# Patient Record
Sex: Female | Born: 1945 | Race: White | Hispanic: No | Marital: Married | Smoking: Never smoker
Health system: Southern US, Community
[De-identification: ages and names within clinical notes are randomized; demographics above are authoritative.]

## PROBLEM LIST (undated history)

## (undated) DIAGNOSIS — Z973 Presence of spectacles and contact lenses: Secondary | ICD-10-CM

## (undated) DIAGNOSIS — C4491 Basal cell carcinoma of skin, unspecified: Secondary | ICD-10-CM

## (undated) DIAGNOSIS — J309 Allergic rhinitis, unspecified: Secondary | ICD-10-CM

## (undated) DIAGNOSIS — S0590XA Unspecified injury of unspecified eye and orbit, initial encounter: Secondary | ICD-10-CM

## (undated) DIAGNOSIS — K219 Gastro-esophageal reflux disease without esophagitis: Secondary | ICD-10-CM

## (undated) DIAGNOSIS — C44319 Basal cell carcinoma of skin of other parts of face: Secondary | ICD-10-CM

## (undated) DIAGNOSIS — N6459 Other signs and symptoms in breast: Secondary | ICD-10-CM

## (undated) DIAGNOSIS — N393 Stress incontinence (female) (male): Secondary | ICD-10-CM

## (undated) DIAGNOSIS — M1990 Unspecified osteoarthritis, unspecified site: Secondary | ICD-10-CM

## (undated) DIAGNOSIS — R609 Edema, unspecified: Secondary | ICD-10-CM

## (undated) DIAGNOSIS — D136 Benign neoplasm of pancreas: Secondary | ICD-10-CM

## (undated) DIAGNOSIS — M8580 Other specified disorders of bone density and structure, unspecified site: Secondary | ICD-10-CM

## (undated) DIAGNOSIS — M5030 Other cervical disc degeneration, unspecified cervical region: Secondary | ICD-10-CM

## (undated) DIAGNOSIS — R011 Cardiac murmur, unspecified: Secondary | ICD-10-CM

## (undated) DIAGNOSIS — C44310 Basal cell carcinoma of skin of unspecified parts of face: Secondary | ICD-10-CM

## (undated) DIAGNOSIS — D649 Anemia, unspecified: Secondary | ICD-10-CM

## (undated) DIAGNOSIS — N951 Menopausal and female climacteric states: Secondary | ICD-10-CM

## (undated) DIAGNOSIS — E785 Hyperlipidemia, unspecified: Secondary | ICD-10-CM

## (undated) DIAGNOSIS — I878 Other specified disorders of veins: Secondary | ICD-10-CM

## (undated) HISTORY — DX: Basal cell carcinoma of skin of unspecified parts of face: C44.310

## (undated) HISTORY — DX: Basal cell carcinoma of skin, unspecified: C44.91

## (undated) HISTORY — DX: Basal cell carcinoma of skin of other parts of face: C44.319

## (undated) HISTORY — DX: Menopausal and female climacteric states: N95.1

## (undated) HISTORY — DX: Hyperlipidemia, unspecified: E78.5

## (undated) HISTORY — DX: Other specified disorders of bone density and structure, unspecified site: M85.80

## (undated) HISTORY — PX: CHOLECYSTECTOMY: SHX55

## (undated) HISTORY — DX: Unspecified osteoarthritis, unspecified site: M19.90

## (undated) HISTORY — DX: Unspecified injury of unspecified eye and orbit, initial encounter: S05.90XA

## (undated) HISTORY — PX: DILATION AND CURETTAGE OF UTERUS: SHX78

## (undated) HISTORY — DX: Stress incontinence (female) (male): N39.3

## (undated) HISTORY — DX: Allergic rhinitis, unspecified: J30.9

## (undated) HISTORY — PX: EYE SURGERY: SHX253

## (undated) HISTORY — PX: APPENDECTOMY: SHX54

## (undated) HISTORY — DX: Other specified disorders of veins: I87.8

## (undated) HISTORY — DX: Edema, unspecified: R60.9

## (undated) SURGERY — Surgical Case
Anesthesia: *Unknown

## (undated) SURGERY — ULTRASOUND, LOWER GI TRACT, ENDOSCOPIC
Anesthesia: General

---

## 1980-05-19 HISTORY — PX: OTHER SURGICAL HISTORY: SHX169

## 1980-05-19 HISTORY — PX: ABDOMINAL HYSTERECTOMY: SHX81

## 2004-07-02 ENCOUNTER — Ambulatory Visit: Payer: Self-pay | Admitting: Internal Medicine

## 2005-06-30 ENCOUNTER — Ambulatory Visit: Payer: Self-pay | Admitting: Internal Medicine

## 2005-07-14 ENCOUNTER — Ambulatory Visit: Payer: Self-pay | Admitting: Internal Medicine

## 2006-07-06 ENCOUNTER — Ambulatory Visit: Payer: Self-pay | Admitting: Internal Medicine

## 2006-07-16 ENCOUNTER — Ambulatory Visit: Payer: Self-pay | Admitting: Internal Medicine

## 2006-07-28 ENCOUNTER — Encounter: Admission: RE | Admit: 2006-07-28 | Discharge: 2006-07-28 | Payer: Self-pay | Admitting: Neurological Surgery

## 2007-08-03 ENCOUNTER — Ambulatory Visit: Payer: Self-pay | Admitting: Internal Medicine

## 2007-11-02 ENCOUNTER — Ambulatory Visit: Payer: Self-pay | Admitting: Unknown Physician Specialty

## 2008-03-30 ENCOUNTER — Ambulatory Visit: Payer: Self-pay | Admitting: Unknown Physician Specialty

## 2008-08-04 ENCOUNTER — Ambulatory Visit: Payer: Self-pay | Admitting: Internal Medicine

## 2009-08-06 ENCOUNTER — Ambulatory Visit: Payer: Self-pay | Admitting: Internal Medicine

## 2010-08-15 ENCOUNTER — Ambulatory Visit: Payer: Self-pay | Admitting: Internal Medicine

## 2011-02-13 ENCOUNTER — Ambulatory Visit: Payer: Self-pay | Admitting: Internal Medicine

## 2011-08-27 ENCOUNTER — Ambulatory Visit: Payer: Self-pay | Admitting: Internal Medicine

## 2011-08-29 ENCOUNTER — Ambulatory Visit: Payer: Self-pay | Admitting: Internal Medicine

## 2012-01-28 DIAGNOSIS — C44310 Basal cell carcinoma of skin of unspecified parts of face: Secondary | ICD-10-CM

## 2012-01-28 HISTORY — DX: Basal cell carcinoma of skin of unspecified parts of face: C44.310

## 2012-09-14 ENCOUNTER — Ambulatory Visit: Payer: Self-pay | Admitting: Internal Medicine

## 2012-10-25 DIAGNOSIS — C44319 Basal cell carcinoma of skin of other parts of face: Secondary | ICD-10-CM

## 2012-10-25 HISTORY — DX: Basal cell carcinoma of skin of other parts of face: C44.319

## 2013-09-15 ENCOUNTER — Ambulatory Visit: Payer: Self-pay | Admitting: Internal Medicine

## 2014-09-12 ENCOUNTER — Other Ambulatory Visit: Payer: Self-pay | Admitting: Internal Medicine

## 2014-09-12 DIAGNOSIS — Z139 Encounter for screening, unspecified: Secondary | ICD-10-CM

## 2014-09-18 ENCOUNTER — Other Ambulatory Visit: Payer: Self-pay | Admitting: Internal Medicine

## 2014-09-18 ENCOUNTER — Ambulatory Visit
Admission: RE | Admit: 2014-09-18 | Discharge: 2014-09-18 | Disposition: A | Discharge status: Home / Self Care | Payer: Medicare Other | Source: Ambulatory Visit | Attending: Internal Medicine | Admitting: Internal Medicine

## 2014-09-18 DIAGNOSIS — Z139 Encounter for screening, unspecified: Secondary | ICD-10-CM

## 2014-09-18 DIAGNOSIS — R928 Other abnormal and inconclusive findings on diagnostic imaging of breast: Secondary | ICD-10-CM | POA: Insufficient documentation

## 2014-09-18 DIAGNOSIS — Z1239 Encounter for other screening for malignant neoplasm of breast: Secondary | ICD-10-CM

## 2014-09-18 DIAGNOSIS — Z1231 Encounter for screening mammogram for malignant neoplasm of breast: Secondary | ICD-10-CM | POA: Diagnosis present

## 2014-09-18 HISTORY — DX: Basal cell carcinoma of skin, unspecified: C44.91

## 2014-09-18 HISTORY — DX: Other signs and symptoms in breast: N64.59

## 2014-09-20 ENCOUNTER — Ambulatory Visit
Admission: RE | Admit: 2014-09-20 | Discharge: 2014-09-20 | Disposition: A | Discharge status: Home / Self Care | Payer: Medicare Other | Source: Ambulatory Visit | Attending: Internal Medicine | Admitting: Internal Medicine

## 2014-09-20 ENCOUNTER — Other Ambulatory Visit: Payer: Self-pay | Admitting: Internal Medicine

## 2014-09-20 DIAGNOSIS — R928 Other abnormal and inconclusive findings on diagnostic imaging of breast: Secondary | ICD-10-CM

## 2014-09-20 DIAGNOSIS — H5315 Visual distortions of shape and size: Secondary | ICD-10-CM | POA: Diagnosis not present

## 2014-09-21 ENCOUNTER — Other Ambulatory Visit: Payer: Self-pay | Admitting: Internal Medicine

## 2014-09-21 DIAGNOSIS — R928 Other abnormal and inconclusive findings on diagnostic imaging of breast: Secondary | ICD-10-CM

## 2014-09-25 ENCOUNTER — Other Ambulatory Visit: Payer: Self-pay | Admitting: Internal Medicine

## 2014-09-25 ENCOUNTER — Ambulatory Visit
Admission: RE | Admit: 2014-09-25 | Discharge: 2014-09-25 | Disposition: A | Discharge status: Home / Self Care | Payer: Medicare Other | Source: Ambulatory Visit | Attending: Internal Medicine | Admitting: Internal Medicine

## 2014-09-25 DIAGNOSIS — R928 Other abnormal and inconclusive findings on diagnostic imaging of breast: Secondary | ICD-10-CM

## 2014-09-25 HISTORY — PX: BREAST BIOPSY: SHX20

## 2014-10-31 ENCOUNTER — Other Ambulatory Visit: Payer: Self-pay | Admitting: Surgery

## 2014-10-31 DIAGNOSIS — R928 Other abnormal and inconclusive findings on diagnostic imaging of breast: Secondary | ICD-10-CM

## 2014-11-14 ENCOUNTER — Encounter
Admission: RE | Admit: 2014-11-14 | Discharge: 2014-11-14 | Disposition: A | Discharge status: Home / Self Care | Payer: Medicare Other | Source: Ambulatory Visit | Attending: Surgery | Admitting: Surgery

## 2014-11-14 DIAGNOSIS — Z01810 Encounter for preprocedural cardiovascular examination: Secondary | ICD-10-CM | POA: Insufficient documentation

## 2014-11-14 DIAGNOSIS — E785 Hyperlipidemia, unspecified: Secondary | ICD-10-CM | POA: Insufficient documentation

## 2014-11-14 HISTORY — DX: Hyperlipidemia, unspecified: E78.5

## 2014-11-14 HISTORY — DX: Cardiac murmur, unspecified: R01.1

## 2014-11-14 HISTORY — DX: Other cervical disc degeneration, unspecified cervical region: M50.30

## 2014-11-14 HISTORY — DX: Unspecified osteoarthritis, unspecified site: M19.90

## 2014-11-14 LAB — BASIC METABOLIC PANEL
ANION GAP: 7 (ref 5–15)
BUN: 19 mg/dL (ref 6–20)
CALCIUM: 9.8 mg/dL (ref 8.9–10.3)
CO2: 28 mmol/L (ref 22–32)
Chloride: 108 mmol/L (ref 101–111)
Creatinine, Ser: 0.87 mg/dL (ref 0.44–1.00)
Glucose, Bld: 99 mg/dL (ref 65–99)
Potassium: 4.7 mmol/L (ref 3.5–5.1)
SODIUM: 143 mmol/L (ref 135–145)

## 2014-11-14 LAB — CBC
HEMATOCRIT: 39.2 % (ref 35.0–47.0)
HEMOGLOBIN: 13.5 g/dL (ref 12.0–16.0)
MCH: 29.7 pg (ref 26.0–34.0)
MCHC: 34.4 g/dL (ref 32.0–36.0)
MCV: 86.3 fL (ref 80.0–100.0)
Platelets: 253 10*3/uL (ref 150–440)
RBC: 4.54 MIL/uL (ref 3.80–5.20)
RDW: 13.8 % (ref 11.5–14.5)
WBC: 6.1 10*3/uL (ref 3.6–11.0)

## 2014-11-14 NOTE — Patient Instructions (Signed)
  Your procedure is scheduled on: November 23, 2014 (Thursday) Report to Mammography. To find out your arrival time please call [phone] between 1PM - 3PM on (PATIENT IS TO REPORT TO [facility] [place] AT 11;15 am).  Remember: Instructions that are not followed completely may result in serious medical risk, up to and including death, or upon the discretion of your surgeon and anesthesiologist your surgery may need to be rescheduled.    __x__ 1. Do not eat food or drink liquids after midnight. No gum chewing or hard candies.     ____ 2. No Alcohol for 24 hours before or after surgery.   ____ 3. Bring all medications with you on the day of surgery if instructed.    __x__ 4. Notify your doctor if there is any change in your medical condition     (cold, fever, infections).     Do not wear jewelry, make-up, hairpins, clips or nail polish.  Do not wear lotions, powders, or perfumes. You may wear deodorant.  Do not shave 48 hours prior to surgery. Men may shave face and neck.  Do not bring valuables to the hospital.    [facility] is not responsible for any belongings or valuables.               Contacts, dentures or bridgework may not be worn into surgery.  Leave your suitcase in the car. After surgery it may be brought to your room.  For patients admitted to the hospital, discharge time is determined by your                treatment team.   Patients discharged the day of surgery will not be allowed to drive home.   Please read over the following fact sheets that you were given:   Surgical Site Infection Prevention   ____ Take these medicines the morning of surgery with A SIP OF WATER:    1.   2.   3.   4.  5.  6.  ____ Fleet Enema (as directed)   _x___ Use CHG Soap as directed  ____ Use inhalers on the day of surgery  ____ Stop metformin 2 days prior to surgery    ____ Take 1/2 of usual insulin dose the night before surgery and none on the morning of surgery.   __x_  Stop Coumadin/Plavix/aspirin on (STOP ASPIRIN ONE WEEK BEFORE SURGERY)  ____ Stop Anti-inflammatories on    __x__ Stop supplements until after surgery.   (STOP BIOTIN NOW)  ____ Bring C-Pap to the hospital.

## 2014-11-23 ENCOUNTER — Ambulatory Visit
Admission: RE | Admit: 2014-11-23 | Discharge: 2014-11-23 | Disposition: A | Discharge status: Home / Self Care | Payer: Medicare Other | Source: Ambulatory Visit | Attending: Surgery | Admitting: Surgery

## 2014-11-23 ENCOUNTER — Encounter: Payer: Self-pay | Admitting: *Deleted

## 2014-11-23 ENCOUNTER — Encounter
Admission: RE | Disposition: A | Discharge status: Home / Self Care | Payer: Self-pay | Source: Ambulatory Visit | Attending: Surgery

## 2014-11-23 ENCOUNTER — Other Ambulatory Visit: Payer: Self-pay | Admitting: Surgery

## 2014-11-23 ENCOUNTER — Ambulatory Visit: Payer: Medicare Other | Admitting: Anesthesiology

## 2014-11-23 DIAGNOSIS — Z801 Family history of malignant neoplasm of trachea, bronchus and lung: Secondary | ICD-10-CM | POA: Insufficient documentation

## 2014-11-23 DIAGNOSIS — M5030 Other cervical disc degeneration, unspecified cervical region: Secondary | ICD-10-CM | POA: Insufficient documentation

## 2014-11-23 DIAGNOSIS — M1990 Unspecified osteoarthritis, unspecified site: Secondary | ICD-10-CM | POA: Insufficient documentation

## 2014-11-23 DIAGNOSIS — E785 Hyperlipidemia, unspecified: Secondary | ICD-10-CM | POA: Diagnosis not present

## 2014-11-23 DIAGNOSIS — Z7982 Long term (current) use of aspirin: Secondary | ICD-10-CM | POA: Diagnosis not present

## 2014-11-23 DIAGNOSIS — R920 Mammographic microcalcification found on diagnostic imaging of breast: Secondary | ICD-10-CM | POA: Insufficient documentation

## 2014-11-23 DIAGNOSIS — Z823 Family history of stroke: Secondary | ICD-10-CM | POA: Insufficient documentation

## 2014-11-23 DIAGNOSIS — Z880 Allergy status to penicillin: Secondary | ICD-10-CM | POA: Diagnosis not present

## 2014-11-23 DIAGNOSIS — N63 Unspecified lump in breast: Secondary | ICD-10-CM | POA: Insufficient documentation

## 2014-11-23 DIAGNOSIS — R928 Other abnormal and inconclusive findings on diagnostic imaging of breast: Secondary | ICD-10-CM

## 2014-11-23 DIAGNOSIS — Z8249 Family history of ischemic heart disease and other diseases of the circulatory system: Secondary | ICD-10-CM | POA: Insufficient documentation

## 2014-11-23 DIAGNOSIS — Z800 Family history of malignant neoplasm of digestive organs: Secondary | ICD-10-CM | POA: Insufficient documentation

## 2014-11-23 DIAGNOSIS — M8580 Other specified disorders of bone density and structure, unspecified site: Secondary | ICD-10-CM | POA: Insufficient documentation

## 2014-11-23 DIAGNOSIS — Z888 Allergy status to other drugs, medicaments and biological substances status: Secondary | ICD-10-CM | POA: Insufficient documentation

## 2014-11-23 DIAGNOSIS — Z79899 Other long term (current) drug therapy: Secondary | ICD-10-CM | POA: Diagnosis not present

## 2014-11-23 DIAGNOSIS — Z90710 Acquired absence of both cervix and uterus: Secondary | ICD-10-CM | POA: Diagnosis not present

## 2014-11-23 DIAGNOSIS — N6489 Other specified disorders of breast: Secondary | ICD-10-CM | POA: Insufficient documentation

## 2014-11-23 DIAGNOSIS — Z85828 Personal history of other malignant neoplasm of skin: Secondary | ICD-10-CM | POA: Insufficient documentation

## 2014-11-23 HISTORY — PX: BREAST BIOPSY: SHX20

## 2014-11-23 HISTORY — PX: BREAST LUMPECTOMY: SHX2

## 2014-11-23 SURGERY — BREAST BIOPSY WITH NEEDLE LOCALIZATION
Anesthesia: General | Laterality: Left | Wound class: Clean

## 2014-11-23 MED ORDER — MIDAZOLAM HCL 5 MG/5ML IJ SOLN
INTRAMUSCULAR | Status: DC | PRN
  Administered 2014-11-23: 2 mg via INTRAVENOUS

## 2014-11-23 MED ORDER — LIDOCAINE HCL (CARDIAC) 20 MG/ML IV SOLN
INTRAVENOUS | Status: DC | PRN
  Administered 2014-11-23: 80 mg via INTRAVENOUS

## 2014-11-23 MED ORDER — DEXAMETHASONE SODIUM PHOSPHATE 10 MG/ML IJ SOLN
INTRAMUSCULAR | Status: DC | PRN
  Administered 2014-11-23: 10 mg via INTRAVENOUS

## 2014-11-23 MED ORDER — BUPIVACAINE-EPINEPHRINE (PF) 0.5% -1:200000 IJ SOLN
INTRAMUSCULAR | Status: AC
  Filled 2014-11-23: qty 30

## 2014-11-23 MED ORDER — EPHEDRINE SULFATE 50 MG/ML IJ SOLN
INTRAMUSCULAR | Status: DC | PRN
  Administered 2014-11-23: 10 mg via INTRAVENOUS

## 2014-11-23 MED ORDER — ONDANSETRON HCL 4 MG/2ML IJ SOLN: 4.0000 mg | Freq: Once | INTRAMUSCULAR | Status: DC | PRN

## 2014-11-23 MED ORDER — FAMOTIDINE 20 MG PO TABS
20.0000 mg | ORAL_TABLET | Freq: Once | ORAL | Status: AC
  Administered 2014-11-23: 20 mg via ORAL

## 2014-11-23 MED ORDER — FAMOTIDINE 20 MG PO TABS
ORAL_TABLET | ORAL | Status: AC
  Administered 2014-11-23: 20 mg via ORAL
  Filled 2014-11-23: qty 1

## 2014-11-23 MED ORDER — LIDOCAINE HCL (PF) 1 % IJ SOLN
INTRAMUSCULAR | Status: AC
  Filled 2014-11-23: qty 30

## 2014-11-23 MED ORDER — HYDROCODONE-ACETAMINOPHEN 5-325 MG PO TABS: 1.0000 | ORAL_TABLET | ORAL | Status: DC | PRN

## 2014-11-23 MED ORDER — PROPOFOL 10 MG/ML IV BOLUS
INTRAVENOUS | Status: DC | PRN
  Administered 2014-11-23: 150 mg via INTRAVENOUS

## 2014-11-23 MED ORDER — ONDANSETRON HCL 4 MG/2ML IJ SOLN
INTRAMUSCULAR | Status: DC | PRN
Start: 2014-11-23 — End: 2014-11-23
  Administered 2014-11-23: 4 mg via INTRAVENOUS

## 2014-11-23 MED ORDER — LACTATED RINGERS IV SOLN
INTRAVENOUS | Status: DC
  Administered 2014-11-23: 13:00:00 via INTRAVENOUS

## 2014-11-23 MED ORDER — FENTANYL CITRATE (PF) 100 MCG/2ML IJ SOLN
INTRAMUSCULAR | Status: DC | PRN
  Administered 2014-11-23 (×2): 50 ug via INTRAVENOUS

## 2014-11-23 MED ORDER — FENTANYL CITRATE (PF) 100 MCG/2ML IJ SOLN: 25.0000 ug | INTRAMUSCULAR | Status: DC | PRN

## 2014-11-23 SURGICAL SUPPLY — 20 items
CANISTER SUCT 1200ML W/VALVE (MISCELLANEOUS) ×3 IMPLANT
CHLORAPREP W/TINT 26ML (MISCELLANEOUS) ×3 IMPLANT
CNTNR SPEC 2.5X3XGRAD LEK (MISCELLANEOUS) ×1
CONT SPEC 4OZ STER OR WHT (MISCELLANEOUS) ×2
CONTAINER SPEC 2.5X3XGRAD LEK (MISCELLANEOUS) ×1 IMPLANT
DRAPE PED LAPAROTOMY (DRAPES) ×3 IMPLANT
GAUZE SPONGE 4X4 12PLY STRL (GAUZE/BANDAGES/DRESSINGS) IMPLANT
GLOVE BIO SURGEON STRL SZ7.5 (GLOVE) ×12 IMPLANT
GOWN STRL REUS W/ TWL LRG LVL3 (GOWN DISPOSABLE) ×4 IMPLANT
GOWN STRL REUS W/TWL LRG LVL3 (GOWN DISPOSABLE) ×8
KIT RM TURNOVER STRD PROC AR (KITS) ×3 IMPLANT
LABEL OR SOLS (LABEL) ×3 IMPLANT
LIQUID BAND (GAUZE/BANDAGES/DRESSINGS) ×3 IMPLANT
NDL SAFETY 25GX1.5 (NEEDLE) ×6 IMPLANT
PACK BASIN MINOR ARMC (MISCELLANEOUS) ×3 IMPLANT
PAD GROUND ADULT SPLIT (MISCELLANEOUS) ×3 IMPLANT
SUT CHROMIC 5 0 RB 1 27 (SUTURE) IMPLANT
SUT MNCRL AB 4-0 PS2 18 (SUTURE) ×3 IMPLANT
SYRINGE 10CC LL (SYRINGE) ×3 IMPLANT
WATER STERILE IRR 1000ML POUR (IV SOLUTION) ×3 IMPLANT

## 2014-11-23 NOTE — H&P (Signed)
  She reports no change in conditions is the day of the office visit. The Kopans wire inserted in the left breast. Mammogram images reviewed.  I discussed the plan for excision of left breast mass.  Rochel Brome M.D.

## 2014-11-23 NOTE — Anesthesia Procedure Notes (Signed)
Procedure Name: LMA Insertion Date/Time: 11/23/2014 1:24 PM Performed by: Delaney Meigs Pre-anesthesia Checklist: Patient identified, Emergency Drugs available, Suction available, Patient being monitored and Timeout performed Patient Re-evaluated:Patient Re-evaluated prior to inductionOxygen Delivery Method: Circle system utilized and Simple face mask Preoxygenation: Pre-oxygenation with 100% oxygen Intubation Type: IV induction Ventilation: Mask ventilation without difficulty LMA Size: 3.5 Number of attempts: 1 Placement Confirmation: breath sounds checked- equal and bilateral and positive ETCO2 Tube secured with: Tape

## 2014-11-23 NOTE — Transfer of Care (Signed)
Immediate Anesthesia Transfer of Care Note  Patient: Tabitha Rice  Procedure(s) Performed: Procedure(s): BREAST BIOPSY WITH NEEDLE LOCALIZATION (Left)  Patient Location: PACU  Anesthesia Type:General  Level of Consciousness: sedated  Airway & Oxygen Therapy: Patient Spontanous Breathing and Patient connected to face mask oxygen  Post-op Assessment: Report given to RN and Post -op Vital signs reviewed and stable  Post vital signs: Reviewed and stable  Last Vitals:  Filed Vitals:   11/23/14 1430  BP: 119/64  Pulse: 96  Temp: 36.6 C  Resp: 19    Complications: No apparent anesthesia complications

## 2014-11-23 NOTE — Discharge Instructions (Addendum)
AMBULATORY SURGERY  DISCHARGE INSTRUCTIONS   1) The drugs that you were given will stay in your system until tomorrow so for the next 24 hours you should not:  A) Drive an automobile B) Make any legal decisions C) Drink any alcoholic beverage   2) You may resume regular meals tomorrow.  Today it is better to start with liquids and gradually work up to solid foods.  You may eat anything you prefer, but it is better to start with liquids, then soup and crackers, and gradually work up to solid foods.   3) Please notify your doctor immediately if you have any unusual bleeding, trouble breathing, redness and pain at the surgery site, drainage, fever, or pain not relieved by medication.    4) Additional Instructions: Take Tylenol or Norco as needed for pain.  Resume aspirin on Saturday  May shower.  Where bra as desired for support.

## 2014-11-23 NOTE — Anesthesia Preprocedure Evaluation (Signed)
Anesthesia Evaluation  Patient identified by MRN, date of birth, ID band Patient awake    Reviewed: Allergy & Precautions, NPO status , Patient's Chart, lab work & pertinent test results, reviewed documented beta blocker date and time   Airway Mallampati: II  TM Distance: >3 FB     Dental  (+) Chipped   Pulmonary          Cardiovascular     Neuro/Psych    GI/Hepatic   Endo/Other    Renal/GU      Musculoskeletal  (+) Arthritis -,   Abdominal   Peds  Hematology   Anesthesia Other Findings   Reproductive/Obstetrics                             Anesthesia Physical Anesthesia Plan  ASA: II  Anesthesia Plan: General   Post-op Pain Management:    Induction: Intravenous  Airway Management Planned: LMA  Additional Equipment:   Intra-op Plan:   Post-operative Plan:   Informed Consent: I have reviewed the patients History and Physical, chart, labs and discussed the procedure including the risks, benefits and alternatives for the proposed anesthesia with the patient or authorized representative who has indicated his/her understanding and acceptance.     Plan Discussed with: CRNA  Anesthesia Plan Comments:         Anesthesia Quick Evaluation

## 2014-11-23 NOTE — Op Note (Signed)
OPERATIVE REPORT  PREOPERATIVE  DIAGNOSIS: . Left breast mass  POSTOPERATIVE DIAGNOSIS: . Left breast mass  PROCEDURE: . Excision left breast mass  ANESTHESIA:  General  SURGEON: Rochel Brome  MD   INDICATIONS: . She had recent mammogram findings of a mass in the upper outer quadrant of the left breast. Needle biopsy demonstrated a complex sclerosing lesion. Excision was recommended for further treatment and evaluation. She did have preoperative x-ray needle localization. Mammogram images were reviewed seeing the Kopan's wire and the location of the nodule and the biopsy marker.  The patient was placed on the operating table in the supine position under general anesthesia. The dressing was removed from the left breast exposing the Kopan's wire which entered the lateral aspect of the left breast. The wire was cut 2 cm from the skin. The breast and surrounding chest wall were prepared with ChloraPrep and draped in a sterile manner.  A curvilinear incision was made in the upper outer quadrant of the left breast some 4 cm from the nipple carried down through subcutaneous tissues. Several small bleeding points were cauterized. The incision was carried down to encounter the Kopan's wire a portion of tissue surrounding the thick portion of the wire was removed there was firmness in this tissue. The mass of tissue was approximately 1.8 x 1.8 x 3 cm in dimension. It was submitted for specimen mammogram and routine pathology. The radiologist did call to report that the nodule and the biopsy marker was seen on the specimen mammogram.  The wound was inspected and several small bleeding points were cauterized. Hemostasis subsequently was intact. Subcutaneous tissues were infiltrated with half percent Sensorcaine with epinephrine. Also tissues surrounding cautery artifact were infiltrated as well. Subcutaneous tissues were approximated with interrupted 4-0 Monocryl. The wound was then closed with running 4-0  Monocryl subcuticular suture and LiquiBand.  The patient appeared to tolerate the procedure satisfactorily and was then prepared for transfer to the recovery room  [facility].D.

## 2014-11-23 NOTE — Anesthesia Postprocedure Evaluation (Signed)
  Anesthesia Post-op Note  Patient: Tabitha Rice  Procedure(s) Performed: Procedure(s): BREAST BIOPSY WITH NEEDLE LOCALIZATION (Left)  Anesthesia type:General  Patient location: PACU  Post pain: Pain level controlled  Post assessment: Post-op Vital signs reviewed, Patient's Cardiovascular Status Stable, Respiratory Function Stable, Patent Airway and No signs of Nausea or vomiting  Post vital signs: Reviewed and stable  Last Vitals:  Filed Vitals:   11/23/14 1603  BP: 142/62  Pulse: 73  Temp:   Resp: 18    Level of consciousness: awake, alert  and patient cooperative  Complications: No apparent anesthesia complications

## 2014-11-23 NOTE — Progress Notes (Signed)
Pt unable to remove wedding bands.  Bands covered with paper tape

## 2014-11-24 ENCOUNTER — Encounter: Payer: Self-pay | Admitting: Surgery

## 2014-11-27 LAB — SURGICAL PATHOLOGY

## 2015-02-12 ENCOUNTER — Ambulatory Visit (INDEPENDENT_AMBULATORY_CARE_PROVIDER_SITE_OTHER): Payer: Medicare Other | Admitting: Urology

## 2015-02-12 ENCOUNTER — Encounter: Payer: Self-pay | Admitting: Urology

## 2015-02-12 VITALS — BP 159/96 | HR 84 | Ht 63.0 in | Wt 146.4 lb

## 2015-02-12 DIAGNOSIS — N393 Stress incontinence (female) (male): Secondary | ICD-10-CM | POA: Diagnosis not present

## 2015-02-12 DIAGNOSIS — N3020 Other chronic cystitis without hematuria: Secondary | ICD-10-CM

## 2015-02-12 DIAGNOSIS — R600 Localized edema: Secondary | ICD-10-CM

## 2015-02-12 DIAGNOSIS — J329 Chronic sinusitis, unspecified: Secondary | ICD-10-CM | POA: Insufficient documentation

## 2015-02-12 DIAGNOSIS — M8580 Other specified disorders of bone density and structure, unspecified site: Secondary | ICD-10-CM

## 2015-02-12 DIAGNOSIS — R350 Frequency of micturition: Secondary | ICD-10-CM

## 2015-02-12 DIAGNOSIS — C4491 Basal cell carcinoma of skin, unspecified: Secondary | ICD-10-CM

## 2015-02-12 DIAGNOSIS — R300 Dysuria: Secondary | ICD-10-CM

## 2015-02-12 DIAGNOSIS — M1990 Unspecified osteoarthritis, unspecified site: Secondary | ICD-10-CM | POA: Insufficient documentation

## 2015-02-12 DIAGNOSIS — N951 Menopausal and female climacteric states: Secondary | ICD-10-CM | POA: Insufficient documentation

## 2015-02-12 DIAGNOSIS — R609 Edema, unspecified: Secondary | ICD-10-CM

## 2015-02-12 DIAGNOSIS — N390 Urinary tract infection, site not specified: Secondary | ICD-10-CM

## 2015-02-12 DIAGNOSIS — S0590XA Unspecified injury of unspecified eye and orbit, initial encounter: Secondary | ICD-10-CM | POA: Insufficient documentation

## 2015-02-12 DIAGNOSIS — J309 Allergic rhinitis, unspecified: Secondary | ICD-10-CM

## 2015-02-12 DIAGNOSIS — Z9109 Other allergy status, other than to drugs and biological substances: Secondary | ICD-10-CM | POA: Insufficient documentation

## 2015-02-12 DIAGNOSIS — I878 Other specified disorders of veins: Secondary | ICD-10-CM | POA: Insufficient documentation

## 2015-02-12 DIAGNOSIS — M5030 Other cervical disc degeneration, unspecified cervical region: Secondary | ICD-10-CM | POA: Insufficient documentation

## 2015-02-12 DIAGNOSIS — E785 Hyperlipidemia, unspecified: Secondary | ICD-10-CM

## 2015-02-12 HISTORY — DX: Stress incontinence (female) (male): N39.3

## 2015-02-12 HISTORY — DX: Unspecified osteoarthritis, unspecified site: M19.90

## 2015-02-12 HISTORY — DX: Localized edema: R60.0

## 2015-02-12 HISTORY — DX: Menopausal and female climacteric states: N95.1

## 2015-02-12 HISTORY — DX: Allergic rhinitis, unspecified: J30.9

## 2015-02-12 HISTORY — DX: Unspecified injury of unspecified eye and orbit, initial encounter: S05.90XA

## 2015-02-12 HISTORY — DX: Basal cell carcinoma of skin, unspecified: C44.91

## 2015-02-12 HISTORY — DX: Hyperlipidemia, unspecified: E78.5

## 2015-02-12 HISTORY — DX: Other specified disorders of veins: I87.8

## 2015-02-12 HISTORY — DX: Other specified disorders of bone density and structure, unspecified site: M85.80

## 2015-02-12 HISTORY — DX: Edema, unspecified: R60.9

## 2015-02-12 LAB — URINALYSIS, COMPLETE
Bilirubin, UA: NEGATIVE
GLUCOSE, UA: NEGATIVE
Ketones, UA: NEGATIVE
Leukocytes, UA: NEGATIVE
Nitrite, UA: NEGATIVE
PH UA: 5.5 (ref 5.0–7.5)
PROTEIN UA: NEGATIVE
RBC, UA: NEGATIVE
Specific Gravity, UA: 1.01 (ref 1.005–1.030)
Urobilinogen, Ur: 0.2 mg/dL (ref 0.2–1.0)

## 2015-02-12 LAB — MICROSCOPIC EXAMINATION
BACTERIA UA: NONE SEEN
RBC, UA: NONE SEEN /hpf (ref 0–?)

## 2015-02-12 LAB — BLADDER SCAN AMB NON-IMAGING

## 2015-02-12 MED ORDER — CIPROFLOXACIN HCL 500 MG PO TABS
500.0000 mg | ORAL_TABLET | Freq: Once | ORAL | Status: AC
  Administered 2015-02-12: 500 mg via ORAL

## 2015-02-12 MED ORDER — LIDOCAINE HCL 2 % EX GEL
1.0000 "application " | Freq: Once | CUTANEOUS | Status: AC
  Administered 2015-02-12: 1 via URETHRAL

## 2015-02-12 NOTE — Progress Notes (Signed)
02/12/2015 10:28 AM   Binnie Rail May 02, 1946 [id number]  Referring provider: Idelle Crouch, MD [place], [place] [address]  Chief Complaint  Patient presents with  . Urinary Incontinence    New Patient    HPI: The patient is a 69 year old woman whose been possibly having recurrent urinary tract infections. If she eats spicy food or coffee she'll get a flare with vaginal pressure associated frequency and sometimes with dysuria. She can have small volume voiding.  At baseline she wears 1 pad per day for mild incontinence but she is often dry. She voids every 2-3 hours and gets up once or twice at night. She denies a history kidney stones previous GU surgery and she has no neurologic risk factors or symptoms. She's had a hysterectomy. Bowel movements are normal  She was recently treated for urinary tract infection with 2 courses of antibodies  Modifying factors: There are no other modifying factors  Associated signs and symptoms: There are no other associated signs and symptoms Aggravating and relieving factors: There are no other aggravating or relieving factors Severity: Moderate Duration: Recurrent     PMH: Past Medical History  Diagnosis Date  . Skin cancer, basal cell   . Inverted nipple     left nipple inverted, nothing new  . Heart murmur   . Arthritis     osteoarthritis  . DDD (degenerative disc disease), cervical   . Hyperlipidemia   . Allergic rhinitis 02/12/2015  . Basal cell carcinoma of face 01/28/2012    Overview:  BCC of left medial cheek treated with Mohs surgery 01-28-12   . Basal cell carcinoma of cheek 10/25/2012    Overview:  Basal cell carcinoma of left medial cheek treated with Mohs surgery on 10/25/2012.   . Arthritis, degenerative 02/12/2015  . Osteopenia 02/12/2015  . CA skin, basal cell 02/12/2015  . HLD (hyperlipidemia) 02/12/2015  . Climacteric 02/12/2015  . Edema, peripheral 02/12/2015  . Injury of eyeball 02/12/2015    Overview:   LEFT RETINAL TRAUMA   . Female genuine stress incontinence 02/12/2015  . Stasis, venous 02/12/2015    Surgical History: Past Surgical History  Procedure Laterality Date  . Abdominal hysterectomy    . Dilation and curettage of uterus    . Breast lumpectomy Left 11/23/2014  . Breast biopsy Left 11/23/2014    Procedure: BREAST BIOPSY WITH NEEDLE LOCALIZATION;  Surgeon: Leonie Green, MD;  Location: [facility] ORS;  Service: General;  Laterality: Left;    Home Medications:    Medication List       This list is accurate as of: 02/12/15 10:28 AM.  Always use your most recent med list.               aspirin EC 81 MG tablet  Take 81 mg by mouth daily.     atorvastatin 10 MG tablet  Commonly known as:  LIPITOR  Take 10 mg by mouth daily at 6 PM.     Biotin 5 MG Caps  Take 5 mg by mouth daily.     cholecalciferol 1000 UNITS tablet  Commonly known as:  VITAMIN D  Take 2,000 Units by mouth daily.     TOLTERODINE TARTRATE ER PO  Take 4 mg by mouth daily.     TYLENOL ARTHRITIS PAIN 650 MG CR tablet  Generic drug:  acetaminophen  Take 650 mg by mouth every 8 (eight) hours as needed for pain.        Allergies:  Allergies  Allergen Reactions  . Zolpidem Tartrate Rash  . Ambien [Zolpidem] Rash  . Penicillins Rash    Family History: Family History  Problem Relation Age of Onset  . Lung cancer Mother   . Stomach cancer Father   . CVA Father   . Lung cancer Brother   . Bladder Cancer Neg Hx   . Prostate cancer Neg Hx     Social History:  reports that she has never smoked. She has never used smokeless tobacco. She reports that she does not drink alcohol or use illicit drugs.  ROS: UROLOGY Frequent Urination?: Yes Hard to postpone urination?: Yes Burning/pain with urination?: Yes Get up at night to urinate?: Yes Leakage of urine?: Yes Urine stream starts and stops?: No Trouble starting stream?: No Do you have to strain to urinate?: No Blood in urine?: Yes Urinary  tract infection?: Yes Sexually transmitted disease?: No Injury to kidneys or bladder?: No Painful intercourse?: No Weak stream?: No Currently pregnant?: No Vaginal bleeding?: No Last menstrual period?: hysterectomy  Gastrointestinal Nausea?: No Vomiting?: No Indigestion/heartburn?: No Diarrhea?: No Constipation?: No  Constitutional Fever: No Night sweats?: No Weight loss?: No Fatigue?: No  Skin Skin rash/lesions?: No Itching?: No  Eyes Blurred vision?: No Double vision?: No  Ears/Nose/Throat Sore throat?: No Sinus problems?: No  Hematologic/Lymphatic Swollen glands?: No Easy bruising?: No  Cardiovascular Leg swelling?: No Chest pain?: No  Respiratory Cough?: No Shortness of breath?: No  Endocrine Excessive thirst?: No  Musculoskeletal Back pain?: Yes Joint pain?: No  Neurological Headaches?: No Dizziness?: No  Psychologic Depression?: No Anxiety?: No  Physical Exam: BP 159/96 mmHg  Pulse 84  Ht 5\' 3"  (1.6 m)  Wt 146 lb 6.4 oz (66.407 kg)  BMI 25.94 kg/m2  Constitutional:  Alert and oriented, No acute distress. HEENT: [place] AT, moist mucus membranes.  Trachea midline, no masses. Cardiovascular: No clubbing, cyanosis, or edema. Respiratory: Normal respiratory effort, no increased work of breathing. GI: Abdomen is soft, nontender, nondistended, no abdominal masses GU: No vaginal atrophy and no stress incontinence or prolapse Skin: No rashes, bruises or suspicious lesions. Lymph: No cervical or inguinal adenopathy. Neurologic: Grossly intact, no focal deficits, moving all 4 extremities. Psychiatric: Normal mood and affect.  Laboratory Data: Lab Results  Component Value Date   WBC 6.1 11/14/2014   HGB 13.5 11/14/2014   HCT 39.2 11/14/2014   MCV 86.3 11/14/2014   PLT 253 11/14/2014    Lab Results  Component Value Date   CREATININE 0.87 11/14/2014     No results found for: HGBA1C  Urinalysis No results found for: COLORURINE,  APPEARANCEUR, LABSPEC, PHURINE, GLUCOSEU, HGBUR, BILIRUBINUR, KETONESUR, PROTEINUR, UROBILINOGEN, NITRITE, LEUKOCYTESUR  Pertinent Imaging: Bladder scan residual 24 mL Urinalysis negative Cystoscopy: After verbal consent the patient underwent cystoscopy. Bladder mucosa and trigone were normal. There is no stitch or foreign body or carcinoma. Urethra looked normal.  Assessment & Plan:  The patient has recurrent flares of vaginal pressure and frequency and may or may not be having recurrent urinary tract infections. Pathophysiology of chronic cystitis discussed. Possibility of interstitial cystitis discussed. She does very well when she minds her diet. The patient's symptoms began in July. It would be very reasonable place her on interstitial cystitis diet and have her follow-up with a renal ultrasound. She may have had a urinary tract infection and now has a recurrent post urinary tract infection hypersensitivity. It may be prudent to start suppression therapy temporarily and stop it in the future. I do not  recommend a hydrodistention at this stage.   1. SUI (stress urinary incontinence, female)  - Urinalysis, Complete - BLADDER SCAN AMB NON-IMAGING  2. Dysuria  - Urinalysis, Complete - BLADDER SCAN AMB NON-IMAGING  3. Urinary frequency  - Urinalysis, Complete - BLADDER SCAN AMB NON-IMAGING  4. Chronic cystitis    Reece Packer, MD  [facility] [address] [place], [place] [address] [phone]

## 2015-02-13 ENCOUNTER — Ambulatory Visit: Payer: Self-pay

## 2015-02-19 ENCOUNTER — Ambulatory Visit
Admission: RE | Admit: 2015-02-19 | Discharge: 2015-02-19 | Disposition: A | Discharge status: Home / Self Care | Payer: Medicare Other | Source: Ambulatory Visit | Attending: Urology | Admitting: Urology

## 2015-02-19 DIAGNOSIS — N200 Calculus of kidney: Secondary | ICD-10-CM | POA: Diagnosis not present

## 2015-02-19 DIAGNOSIS — N390 Urinary tract infection, site not specified: Secondary | ICD-10-CM | POA: Insufficient documentation

## 2015-02-19 DIAGNOSIS — N281 Cyst of kidney, acquired: Secondary | ICD-10-CM | POA: Insufficient documentation

## 2015-02-28 ENCOUNTER — Ambulatory Visit (INDEPENDENT_AMBULATORY_CARE_PROVIDER_SITE_OTHER): Payer: Medicare Other | Admitting: Urology

## 2015-02-28 ENCOUNTER — Encounter: Payer: Self-pay | Admitting: Urology

## 2015-02-28 VITALS — BP 150/74 | HR 94 | Ht 63.0 in | Wt 146.9 lb

## 2015-02-28 DIAGNOSIS — R1084 Generalized abdominal pain: Secondary | ICD-10-CM | POA: Diagnosis not present

## 2015-02-28 DIAGNOSIS — R351 Nocturia: Secondary | ICD-10-CM | POA: Diagnosis not present

## 2015-02-28 DIAGNOSIS — R3129 Other microscopic hematuria: Secondary | ICD-10-CM

## 2015-02-28 DIAGNOSIS — N390 Urinary tract infection, site not specified: Secondary | ICD-10-CM | POA: Diagnosis not present

## 2015-02-28 DIAGNOSIS — R109 Unspecified abdominal pain: Secondary | ICD-10-CM | POA: Insufficient documentation

## 2015-02-28 LAB — URINALYSIS, COMPLETE
Bilirubin, UA: NEGATIVE
Glucose, UA: NEGATIVE
Ketones, UA: NEGATIVE
Nitrite, UA: NEGATIVE
PH UA: 5.5 (ref 5.0–7.5)
PROTEIN UA: NEGATIVE
Specific Gravity, UA: 1.02 (ref 1.005–1.030)
Urobilinogen, Ur: 0.2 mg/dL (ref 0.2–1.0)

## 2015-02-28 LAB — MICROSCOPIC EXAMINATION: RENAL EPITHEL UA: NONE SEEN /HPF

## 2015-02-28 NOTE — Progress Notes (Addendum)
02/28/2015 9:39 AM   Tabitha Rice 07-Sep-1945 [id number]  Referring provider: Idelle Crouch, MD [facility] [place], [place] [address]  Chief Complaint  Patient presents with  . Recurrent UTI    Renal u/s results    HPI: The patient has flares of discomfort and frequency and may or may not have true arterial cystitis. She had an ultrasound. She is a 7 mm stone in her right kidney nonobstructing.  Frequency stable  Review of systems no change in bowel her neurologic status    [name] no longer has burning or frequency and she started be different foods and feels great. Frequency is stable  PMH: Past Medical History  Diagnosis Date  . Skin cancer, basal cell   . Inverted nipple     left nipple inverted, nothing new  . Heart murmur   . Arthritis     osteoarthritis  . DDD (degenerative disc disease), cervical   . Hyperlipidemia   . Allergic rhinitis 02/12/2015  . Basal cell carcinoma of face 01/28/2012    Overview:  BCC of left medial cheek treated with Mohs surgery 01-28-12   . Basal cell carcinoma of cheek 10/25/2012    Overview:  Basal cell carcinoma of left medial cheek treated with Mohs surgery on 10/25/2012.   . Arthritis, degenerative 02/12/2015  . Osteopenia 02/12/2015  . CA skin, basal cell 02/12/2015  . HLD (hyperlipidemia) 02/12/2015  . Climacteric 02/12/2015  . Edema, peripheral 02/12/2015  . Injury of eyeball 02/12/2015    Overview:  LEFT RETINAL TRAUMA   . Female genuine stress incontinence 02/12/2015  . Stasis, venous 02/12/2015    Surgical History: Past Surgical History  Procedure Laterality Date  . Abdominal hysterectomy    . Dilation and curettage of uterus    . Breast lumpectomy Left 11/23/2014  . Breast biopsy Left 11/23/2014    Procedure: BREAST BIOPSY WITH NEEDLE LOCALIZATION;  Surgeon: Leonie Green, MD;  Location: [facility] ORS;  Service: General;  Laterality: Left;    Home Medications:    Medication List       This  list is accurate as of: 02/28/15  9:39 AM.  Always use your most recent med list.               aspirin EC 81 MG tablet  Take 81 mg by mouth daily.     atorvastatin 10 MG tablet  Commonly known as:  LIPITOR  Take 10 mg by mouth daily at 6 PM.     Biotin 5 MG Caps  Take 5 mg by mouth daily.     cholecalciferol 1000 UNITS tablet  Commonly known as:  VITAMIN D  Take 2,000 Units by mouth daily.     TOLTERODINE TARTRATE ER PO  Take 4 mg by mouth daily.     TYLENOL ARTHRITIS PAIN 650 MG CR tablet  Generic drug:  acetaminophen  Take 650 mg by mouth every 8 (eight) hours as needed for pain.        Allergies:  Allergies  Allergen Reactions  . Zolpidem Tartrate Rash  . Ambien [Zolpidem] Rash  . Penicillins Rash    Family History: Family History  Problem Relation Age of Onset  . Lung cancer Mother   . Stomach cancer Father   . CVA Father   . Lung cancer Brother   . Bladder Cancer Neg Hx   . Prostate cancer Neg Hx     Social History:  reports that  she has never smoked. She has never used smokeless tobacco. She reports that she does not drink alcohol or use illicit drugs.  ROS: UROLOGY Frequent Urination?: No Hard to postpone urination?: No Burning/pain with urination?: No Get up at night to urinate?: No Leakage of urine?: No Urine stream starts and stops?: No Trouble starting stream?: No Do you have to strain to urinate?: No Blood in urine?: No Urinary tract infection?: No Sexually transmitted disease?: No Injury to kidneys or bladder?: No Painful intercourse?: No Weak stream?: No Currently pregnant?: No Vaginal bleeding?: No Last menstrual period?: hysterectomy  Gastrointestinal Nausea?: No Vomiting?: No Indigestion/heartburn?: No Diarrhea?: No Constipation?: No  Constitutional Fever: No Night sweats?: No Weight loss?: No Fatigue?: No  Skin Skin rash/lesions?: No Itching?: No  Eyes Blurred vision?: No Double vision?:  No  Ears/Nose/Throat Sore throat?: No Sinus problems?: No  Hematologic/Lymphatic Swollen glands?: No Easy bruising?: No  Cardiovascular Leg swelling?: No Chest pain?: No  Respiratory Cough?: No Shortness of breath?: No  Endocrine Excessive thirst?: No  Musculoskeletal Back pain?: No Joint pain?: No  Neurological Headaches?: No Dizziness?: No  Psychologic Depression?: No Anxiety?: No  Physical Exam: BP 150/74 mmHg  Pulse 94  Ht 5\' 3"  (1.6 m)  Wt 66.633 kg (146 lb 14.4 oz)  BMI 26.03 kg/m2  Constitutional:  Alert and oriented, No acute distress. HEENT: [place] AT, moist mucus membranes.  Trachea midline, no masses. Cardiovascular: No clubbing, cyanosis, or edema. Respiratory: Normal respiratory effort, no increased work of breathing. GI: Abdomen is soft, nontender, nondistended, no abdominal masses GU: No CVA tenderness.  Skin: No rashes, bruises or suspicious lesions. Lymph: No cervical or inguinal adenopathy. Neurologic: Grossly intact, no focal deficits, moving all 4 extremities. Psychiatric: Normal mood and affect.  Laboratory Data: Lab Results  Component Value Date   WBC 6.1 11/14/2014   HGB 13.5 11/14/2014   HCT 39.2 11/14/2014   MCV 86.3 11/14/2014   PLT 253 11/14/2014    Lab Results  Component Value Date   CREATININE 0.87 11/14/2014    No results found for: PSA  No results found for: TESTOSTERONE  No results found for: HGBA1C  Urinalysis    Component Value Date/Time   GLUCOSEU Negative 02/12/2015 1005   BILIRUBINUR Negative 02/12/2015 1005   NITRITE Negative 02/12/2015 1005   LEUKOCYTESUR Negative 02/12/2015 1005    Pertinent Imaging:   Assessment & Plan:    1. Recurrent UTI She reminded me that she's not getting recurrent infections but was having lingering symptoms. She now is back to baseline. I drew her a picture about her small stone. I will see her when necessary - Urinalysis, Complete       Reece Packer,  [facility] [address], [place],  [address] [phone]

## 2015-02-28 NOTE — Addendum Note (Signed)
Addended by: Bjorn Loser on: 02/28/2015 09:49 AM   Modules accepted: Level of Service

## 2015-02-28 NOTE — Addendum Note (Signed)
Addended by: Tommy Rainwater on: 02/28/2015 09:43 AM   Modules accepted: Orders

## 2015-05-20 HISTORY — PX: WHIPPLE PROCEDURE: SHX2667

## 2015-07-03 ENCOUNTER — Other Ambulatory Visit: Payer: Self-pay | Admitting: Internal Medicine

## 2015-07-03 DIAGNOSIS — R109 Unspecified abdominal pain: Secondary | ICD-10-CM

## 2015-07-05 ENCOUNTER — Other Ambulatory Visit: Payer: Self-pay | Admitting: Internal Medicine

## 2015-07-05 ENCOUNTER — Ambulatory Visit
Admission: RE | Admit: 2015-07-05 | Discharge: 2015-07-05 | Disposition: A | Discharge status: Home / Self Care | Payer: Medicare Other | Source: Ambulatory Visit | Attending: Internal Medicine | Admitting: Internal Medicine

## 2015-07-05 DIAGNOSIS — N281 Cyst of kidney, acquired: Secondary | ICD-10-CM | POA: Diagnosis not present

## 2015-07-05 DIAGNOSIS — K869 Disease of pancreas, unspecified: Secondary | ICD-10-CM | POA: Insufficient documentation

## 2015-07-05 DIAGNOSIS — K8689 Other specified diseases of pancreas: Secondary | ICD-10-CM

## 2015-07-05 DIAGNOSIS — R109 Unspecified abdominal pain: Secondary | ICD-10-CM

## 2015-07-05 DIAGNOSIS — N200 Calculus of kidney: Secondary | ICD-10-CM | POA: Diagnosis not present

## 2015-07-05 DIAGNOSIS — R932 Abnormal findings on diagnostic imaging of liver and biliary tract: Secondary | ICD-10-CM | POA: Insufficient documentation

## 2015-07-25 ENCOUNTER — Ambulatory Visit
Admission: RE | Admit: 2015-07-25 | Discharge: 2015-07-25 | Disposition: A | Discharge status: Home / Self Care | Payer: Medicare Other | Source: Ambulatory Visit | Attending: Internal Medicine | Admitting: Internal Medicine

## 2015-07-25 DIAGNOSIS — N281 Cyst of kidney, acquired: Secondary | ICD-10-CM | POA: Insufficient documentation

## 2015-07-25 DIAGNOSIS — R935 Abnormal findings on diagnostic imaging of other abdominal regions, including retroperitoneum: Secondary | ICD-10-CM | POA: Diagnosis not present

## 2015-07-25 DIAGNOSIS — K869 Disease of pancreas, unspecified: Secondary | ICD-10-CM | POA: Insufficient documentation

## 2015-07-25 DIAGNOSIS — K8689 Other specified diseases of pancreas: Secondary | ICD-10-CM

## 2015-07-25 MED ORDER — GADOBENATE DIMEGLUMINE 529 MG/ML IV SOLN
15.0000 mL | Freq: Once | INTRAVENOUS | Status: AC | PRN
  Administered 2015-07-25: 13 mL via INTRAVENOUS

## 2015-08-09 ENCOUNTER — Other Ambulatory Visit: Payer: Self-pay

## 2015-08-09 ENCOUNTER — Ambulatory Visit (INDEPENDENT_AMBULATORY_CARE_PROVIDER_SITE_OTHER): Payer: Medicare Other | Admitting: Gastroenterology

## 2015-08-09 ENCOUNTER — Telehealth: Payer: Self-pay | Admitting: Gastroenterology

## 2015-08-09 ENCOUNTER — Encounter: Payer: Self-pay | Admitting: Gastroenterology

## 2015-08-09 VITALS — BP 153/75 | HR 72 | Ht 63.0 in | Wt 149.0 lb

## 2015-08-09 DIAGNOSIS — K8689 Other specified diseases of pancreas: Secondary | ICD-10-CM

## 2015-08-09 DIAGNOSIS — K869 Disease of pancreas, unspecified: Secondary | ICD-10-CM

## 2015-08-09 NOTE — Telephone Encounter (Signed)
No call was made.

## 2015-08-09 NOTE — Progress Notes (Signed)
Gastroenterology Consultation  Referring Provider:     Idelle Crouch, MD Primary Care Physician:  Idelle Crouch, MD Primary Gastroenterologist:  Dr. Allen Norris     Reason for Consultation:     Pancreatic mass        HPI:   Tabitha Rice is a 70 y.o. y/o female referred for consultation & management of  Pancreatic mass by Dr. Idelle Crouch, MD.   This patient comes today with a history of having abdominal pain. The patient underwent a ultrasound with a follow-up CT because of a pancreatic mass seen in the head of the pancreas. The radiologist reported this to be a neuroendocrine tumor versus a possible metastatic melanoma. It was recommended that the patient undergo endoscopic ultrasound. The patient denies any further abdominal pain. There is no report of any unexplained weight loss. She also denies any jaundice. She does have some looser stools recently but states that she bases due from stress.  Past Medical History  Diagnosis Date  . Skin cancer, basal cell   . Inverted nipple     left nipple inverted, nothing new  . Heart murmur   . Arthritis     osteoarthritis  . DDD (degenerative disc disease), cervical   . Hyperlipidemia   . Allergic rhinitis 02/12/2015  . Basal cell carcinoma of face 01/28/2012    Overview:  BCC of left medial cheek treated with Mohs surgery 01-28-12   . Basal cell carcinoma of cheek 10/25/2012    Overview:  Basal cell carcinoma of left medial cheek treated with Mohs surgery on 10/25/2012.   . Arthritis, degenerative 02/12/2015  . Osteopenia 02/12/2015  . CA skin, basal cell 02/12/2015  . HLD (hyperlipidemia) 02/12/2015  . Climacteric 02/12/2015  . Edema, peripheral 02/12/2015  . Injury of eyeball 02/12/2015    Overview:  LEFT RETINAL TRAUMA   . Female genuine stress incontinence 02/12/2015  . Stasis, venous 02/12/2015    Past Surgical History  Procedure Laterality Date  . Abdominal hysterectomy    . Dilation and curettage of uterus    . Breast lumpectomy  Left 11/23/2014  . Breast biopsy Left 11/23/2014    Procedure: BREAST BIOPSY WITH NEEDLE LOCALIZATION;  Surgeon: Leonie Green, MD;  Location: [facility] ORS;  Service: General;  Laterality: Left;    Prior to Admission medications   Medication Sig Start Date End Date Taking? Authorizing Provider  acetaminophen (TYLENOL ARTHRITIS PAIN) 650 MG CR tablet Take 650 mg by mouth every 8 (eight) hours as needed for pain.   Yes Historical Provider, MD  aspirin EC 81 MG tablet Take 81 mg by mouth daily.   Yes Historical Provider, MD  atorvastatin (LIPITOR) 10 MG tablet Take 10 mg by mouth daily at 6 PM.   Yes Historical Provider, MD  Biotin 5 MG CAPS Take 5 mg by mouth daily.   Yes Historical Provider, MD  cholecalciferol (VITAMIN D) 1000 UNITS tablet Take 2,000 Units by mouth daily.   Yes Historical Provider, MD  TOLTERODINE TARTRATE ER PO Take 4 mg by mouth daily.   Yes Historical Provider, MD    Family History  Problem Relation Age of Onset  . Lung cancer Mother   . Stomach cancer Father   . CVA Father   . Lung cancer Brother   . Bladder Cancer Neg Hx   . Prostate cancer Neg Hx      Social History  Substance Use Topics  . Smoking status: Never Smoker   . Smokeless  tobacco: Never Used  . Alcohol Use: No    Allergies as of 08/09/2015 - Review Complete 02/28/2015  Allergen Reaction Noted  . Zolpidem tartrate Rash 02/12/2015  . Ambien [zolpidem] Rash 11/14/2014  . Penicillins Rash 11/14/2014    Review of Systems:    All systems reviewed and negative except where noted in HPI.   Physical Exam:  BP 153/75 mmHg  Pulse 72  Ht 5\' 3"  (1.6 m)  Wt 149 lb (67.586 kg)  BMI 26.40 kg/m2 No LMP recorded. Patient has had a hysterectomy. Psych:  Alert and cooperative. Normal mood and affect. General:   Alert,  Well-developed, well-nourished, pleasant and cooperative in NAD Head:  Normocephalic and atraumatic. Eyes:  Sclera clear, no icterus.   Conjunctiva pink. Ears:  Normal auditory  acuity. Nose:  No deformity, discharge, or lesions. Mouth:  No deformity or lesions,oropharynx pink & moist. Neck:  Supple; no masses or thyromegaly. Lungs:  Respirations even and unlabored.  Clear throughout to auscultation.   No wheezes, crackles, or rhonchi. No acute distress. Heart:  Regular rate and rhythm; no murmurs, clicks, rubs, or gallops. Abdomen:  Normal bowel sounds.  No bruits.  Soft, non-tender and non-distended without masses, hepatosplenomegaly or hernias noted.  No guarding or rebound tenderness.  Negative Carnett sign.   Rectal:  Deferred.  Msk:  Symmetrical without gross deformities.  Good, equal movement & strength bilaterally. Pulses:  Normal pulses noted. Extremities:  No clubbing or edema.  No cyanosis. Neurologic:  Alert and oriented x3;  grossly normal neurologically. Skin:  Intact without significant lesions or rashes.  No jaundice. Lymph Nodes:  No significant cervical adenopathy. Psych:  Alert and cooperative. Normal mood and affect.  Imaging Studies: Mr Abdomen W Wo Contrast  07/25/2015  CLINICAL DATA:  70 year old female with history of left-sided abdominal pain for the past 2 months. Abnormal ultrasound demonstrating potential pancreatic lesion. EXAM: MRI ABDOMEN WITHOUT AND WITH CONTRAST TECHNIQUE: Multiplanar multisequence MR imaging of the abdomen was performed both before and after the administration of intravenous contrast. CONTRAST:  37mL MULTIHANCE GADOBENATE DIMEGLUMINE 529 MG/ML IV SOLN COMPARISON:  No prior abdominal MRI. Abdominal ultrasound 07/05/2015. FINDINGS: Lower chest:  Unremarkable. Hepatobiliary: No cystic or solid hepatic lesions. MRCP images demonstrate no intra or extrahepatic biliary ductal dilatation. Common bile duct measures up to 4 mm in the porta hepatis. Gallbladder is normal in appearance. Pancreas: In the head of the pancreas there is a well-circumscribed lesion which measures approximately 1.8 x 1.5 x 1.7 cm (axial image 44 of series 7  and coronal image 14 of series 2). This lesion is slightly T1 hypointense, slightly T2 hyperintense, and demonstrates avid enhancement on arterial phase post gadolinium imaging, rapidly normalizing to the remainder of the pancreatic parenchyma on more delayed phases. This lesion immediately abuts the superior mesenteric vein, although there appears to be an intervening fat plane. The vein is slightly distorted as it passes by the lesion, presumably from mass effect. The lesion appears to be separate from the splenic vein and splenoportal confluence. The lesion is separate from the superior mesenteric artery, and does not extend posteriorly to involve the inferior vena cava or renal veins. The lesion exerts some mild mass effect upon the main pancreatic duct, however, the duct is normal in caliber measuring up to 3 mm. Tiny lesions in the tail of the pancreas measure 4-5 mm and are T1 hypointense, T2 hyperintense, and do not enhance, favored to represent benign cystic lesions. No peripancreatic inflammatory changes or focal  fluid collections. Spleen: Unremarkable. Adrenals/Urinary Tract: Small lesions in the kidneys bilaterally are T1 hypointense, T2 hyperintense and do not enhance, compatible with simple cysts, largest of which is exophytic measuring 1.3 cm in the interpolar region of the right kidney. No hydroureteronephrosis in the visualized abdomen. Bilateral adrenal glands are normal in appearance. Stomach/Bowel: Visualized portions are unremarkable. Vascular/Lymphatic: No aneurysm identified in the visualized abdominal vasculature. Vascular findings pertinent to the pancreatic lesion, as discussed above. No lymphadenopathy noted in the abdomen. Other: No significant volume of ascites in the visualized peritoneal cavity. Musculoskeletal: No aggressive appearing osseous lesions are noted in the visualized portions of the skeleton. IMPRESSION: 1. 1.8 x 1.5 x 1.7 cm hypervascular lesion noted in the head of the  pancreas has imaging characteristics concerning for small neuroendocrine cell neoplasm. Other differential considerations would include hypervascular metastasis (such as melanoma). Correlation with endoscopic ultrasound and consideration for biopsy is recommended to establish a tissue diagnosis. 2. Small simple cysts in the kidneys bilaterally. Electronically Signed   By: Vinnie Langton M.D.   On: 07/25/2015 09:58    Assessment and Plan:   Tabitha Rice is a 70 y.o. y/o female who had a finding of a lesion on the pancreas suspicious for a neuroendocrine tumor versus a metastatic melanoma. The patient will be set up for a endoscopic ultrasound. She will also have her blood sent off for  CA 19 - 9. The patient has been explained the plan and agrees with it.   Note: This dictation was prepared with Dragon dictation along with smaller phrase technology. Any transcriptional errors that result from this process are unintentional.

## 2015-08-10 ENCOUNTER — Other Ambulatory Visit: Payer: Self-pay

## 2015-08-10 ENCOUNTER — Telehealth: Payer: Self-pay

## 2015-08-10 DIAGNOSIS — K869 Disease of pancreas, unspecified: Secondary | ICD-10-CM

## 2015-08-10 DIAGNOSIS — K8689 Other specified diseases of pancreas: Secondary | ICD-10-CM

## 2015-08-10 DIAGNOSIS — R932 Abnormal findings on diagnostic imaging of liver and biliary tract: Secondary | ICD-10-CM

## 2015-08-10 DIAGNOSIS — C250 Malignant neoplasm of head of pancreas: Secondary | ICD-10-CM

## 2015-08-10 NOTE — Telephone Encounter (Signed)
  Oncology Nurse Navigator Documentation  Navigator Location: [facility] (08/10/15 1000) Navigator Encounter Type: Telephone;Introductory phone call (08/10/15 1000)   Abnormal Finding Date: 07/05/15 (08/10/15 1000)           Barriers/Navigation Needs: Coordination of Care (08/10/15 1000)   Interventions: Coordination of Care (08/10/15 1000)   Coordination of Care: EUS (08/10/15 1000)        Acuity: Level 2 (08/10/15 1000)   Acuity Level 2: Initial guidance, education and coordination as needed;Educational needs;Assistance expediting appointments;Ongoing guidance and education throughout treatment as needed (08/10/15 1000)     Time Spent with Patient: 30 (08/10/15 1000)   Received referral from Dr Allen Norris for EUS for pancreatic head mass. This service is not available at [facility] until 08-30-15. Patient will be referred to [facility] to expedite biopsy. I have spoken with Tabitha Rice and she is agreeable to have EUS performed at [facility]. I have sent request to [facility] and they will contact her for appt. Provided my contact information and instructed her to call if she has not heard from [place] in 3 days. Read back performed.

## 2015-08-11 LAB — CANCER ANTIGEN 19-9: CA 19-9: 6 U/mL (ref 0–35)

## 2015-08-13 NOTE — Telephone Encounter (Signed)
Pt notified of lab result. Pt is scheduled for EUS on Tuesday, March 28th.

## 2015-08-13 NOTE — Telephone Encounter (Signed)
-----   Message from Lucilla Lame, MD sent at 08/12/2015  8:59 AM EDT ----- Let the patient know the tumor marker we spoke about was negative. Still needs EUS.

## 2015-08-14 NOTE — Progress Notes (Signed)
  Oncology Nurse Navigator Documentation  Navigator Location: [facility] (08/14/15 1300)                         Coordination of Care: EUS (08/14/15 1300)                  Time Spent with Patient: 30 (08/14/15 1300)   EUS performed 08/14/15 at [facility]. Results sent to Dr Allen Norris. Per Dr Burbridge(perfoming MD)  EUS performed today on [facility], December 07, 1945.  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  Fine needle biopsy performed.  I made her a referral to [facility] per her request.  Please let the referring physicians know.  Thanks!  Delton See, MD Assistant Professor of Medicine, Division of Gastroenterology Director of Advanced Endoscopy Associate Director of Clinical Endoscopy

## 2015-12-07 ENCOUNTER — Other Ambulatory Visit: Payer: Self-pay | Admitting: Internal Medicine

## 2015-12-07 DIAGNOSIS — Z1231 Encounter for screening mammogram for malignant neoplasm of breast: Secondary | ICD-10-CM

## 2015-12-10 ENCOUNTER — Ambulatory Visit: Payer: Medicare Other

## 2015-12-21 ENCOUNTER — Ambulatory Visit
Admission: RE | Admit: 2015-12-21 | Discharge: 2015-12-21 | Disposition: A | Discharge status: Home / Self Care | Payer: Medicare Other | Source: Ambulatory Visit | Attending: Internal Medicine | Admitting: Internal Medicine

## 2015-12-21 ENCOUNTER — Other Ambulatory Visit: Payer: Self-pay | Admitting: Internal Medicine

## 2015-12-21 DIAGNOSIS — R928 Other abnormal and inconclusive findings on diagnostic imaging of breast: Secondary | ICD-10-CM | POA: Insufficient documentation

## 2015-12-21 DIAGNOSIS — Z1231 Encounter for screening mammogram for malignant neoplasm of breast: Secondary | ICD-10-CM | POA: Diagnosis present

## 2015-12-27 ENCOUNTER — Ambulatory Visit
Admission: RE | Admit: 2015-12-27 | Discharge: 2015-12-27 | Disposition: A | Discharge status: Home / Self Care | Payer: Medicare Other | Source: Ambulatory Visit | Attending: Internal Medicine | Admitting: Internal Medicine

## 2015-12-27 DIAGNOSIS — R928 Other abnormal and inconclusive findings on diagnostic imaging of breast: Secondary | ICD-10-CM

## 2016-01-04 ENCOUNTER — Other Ambulatory Visit: Payer: Medicare Other

## 2016-01-04 ENCOUNTER — Ambulatory Visit: Payer: Medicare Other

## 2016-09-08 ENCOUNTER — Other Ambulatory Visit: Payer: Self-pay | Admitting: Internal Medicine

## 2016-09-08 DIAGNOSIS — Z1231 Encounter for screening mammogram for malignant neoplasm of breast: Secondary | ICD-10-CM

## 2016-09-08 DIAGNOSIS — R911 Solitary pulmonary nodule: Secondary | ICD-10-CM

## 2016-09-15 ENCOUNTER — Ambulatory Visit
Admission: RE | Admit: 2016-09-15 | Discharge: 2016-09-15 | Disposition: A | Discharge status: Home / Self Care | Payer: Medicare Other | Source: Ambulatory Visit | Attending: Internal Medicine | Admitting: Internal Medicine

## 2016-09-15 DIAGNOSIS — N200 Calculus of kidney: Secondary | ICD-10-CM | POA: Insufficient documentation

## 2016-09-15 DIAGNOSIS — R911 Solitary pulmonary nodule: Secondary | ICD-10-CM | POA: Diagnosis not present

## 2016-12-22 ENCOUNTER — Ambulatory Visit
Admission: RE | Admit: 2016-12-22 | Discharge: 2016-12-22 | Disposition: A | Discharge status: Home / Self Care | Payer: Medicare Other | Source: Ambulatory Visit | Attending: Internal Medicine | Admitting: Internal Medicine

## 2016-12-22 DIAGNOSIS — Z1231 Encounter for screening mammogram for malignant neoplasm of breast: Secondary | ICD-10-CM | POA: Diagnosis not present

## 2016-12-25 ENCOUNTER — Other Ambulatory Visit: Payer: Self-pay | Admitting: Internal Medicine

## 2016-12-25 DIAGNOSIS — N6489 Other specified disorders of breast: Secondary | ICD-10-CM

## 2017-01-07 ENCOUNTER — Ambulatory Visit
Admission: RE | Admit: 2017-01-07 | Discharge: 2017-01-07 | Disposition: A | Discharge status: Home / Self Care | Payer: Medicare Other | Source: Ambulatory Visit | Attending: Internal Medicine | Admitting: Internal Medicine

## 2017-01-07 DIAGNOSIS — N6489 Other specified disorders of breast: Secondary | ICD-10-CM

## 2017-01-08 ENCOUNTER — Other Ambulatory Visit: Payer: Self-pay | Admitting: Internal Medicine

## 2017-01-08 DIAGNOSIS — R928 Other abnormal and inconclusive findings on diagnostic imaging of breast: Secondary | ICD-10-CM

## 2017-01-13 ENCOUNTER — Ambulatory Visit
Admission: RE | Admit: 2017-01-13 | Discharge: 2017-01-13 | Disposition: A | Discharge status: Home / Self Care | Payer: Medicare Other | Source: Ambulatory Visit | Attending: Internal Medicine | Admitting: Internal Medicine

## 2017-01-13 DIAGNOSIS — R928 Other abnormal and inconclusive findings on diagnostic imaging of breast: Secondary | ICD-10-CM | POA: Diagnosis present

## 2017-01-13 DIAGNOSIS — N6091 Unspecified benign mammary dysplasia of right breast: Secondary | ICD-10-CM | POA: Diagnosis not present

## 2017-01-13 DIAGNOSIS — L905 Scar conditions and fibrosis of skin: Secondary | ICD-10-CM | POA: Insufficient documentation

## 2017-01-13 HISTORY — PX: BREAST BIOPSY: SHX20

## 2017-01-14 LAB — SURGICAL PATHOLOGY

## 2017-02-09 ENCOUNTER — Other Ambulatory Visit: Payer: Self-pay | Admitting: Surgery

## 2017-02-09 DIAGNOSIS — N63 Unspecified lump in unspecified breast: Secondary | ICD-10-CM

## 2017-02-13 ENCOUNTER — Encounter
Admission: RE | Admit: 2017-02-13 | Discharge: 2017-02-13 | Disposition: A | Discharge status: Home / Self Care | Payer: Medicare Other | Source: Ambulatory Visit | Attending: Surgery | Admitting: Surgery

## 2017-02-13 DIAGNOSIS — R011 Cardiac murmur, unspecified: Secondary | ICD-10-CM | POA: Insufficient documentation

## 2017-02-13 DIAGNOSIS — Z01810 Encounter for preprocedural cardiovascular examination: Secondary | ICD-10-CM | POA: Insufficient documentation

## 2017-02-13 NOTE — Patient Instructions (Signed)
  Your procedure is scheduled on: 02-20-17 FRIDAY Report to [place] @ 11 AM  Remember: Instructions that are not followed completely may result in serious medical risk, up to and including death, or upon the discretion of your surgeon and anesthesiologist your surgery may need to be rescheduled.    _x___ 1. Do not eat food after midnight the night before your procedure. NO GUM CHEWING OR HARD CANDIES.  You may drink clear liquids up to 2 hours before you are scheduled to arrive at the hospital for your procedure.  Do not drink clear liquids within 2 hours of your scheduled arrival to the hospital.  Clear liquids include  --Water or Apple juice without pulp  --Clear carbohydrate beverage such as ClearFast or Gatorade  --Black Coffee or Clear Tea (No milk, no creamers, do not add anything to the coffee or Tea  Type 1 and type 2 diabetics should only drink water.     __x__ 2. No Alcohol for 24 hours before or after surgery.   __x__3. No Smoking for 24 prior to surgery.   ____  4. Bring all medications with you on the day of surgery if instructed.    __x__ 5. Notify your doctor if there is any change in your medical condition     (cold, fever, infections).     Do not wear jewelry, make-up, hairpins, clips or nail polish.  Do not wear lotions, powders, or perfumes. You may wear deodorant.  Do not shave 48 hours prior to surgery. Men may shave face and neck.  Do not bring valuables to the hospital.    [facility] is not responsible for any belongings or valuables.               Contacts, dentures or bridgework may not be worn into surgery.  Leave your suitcase in the car. After surgery it may be brought to your room.  For patients admitted to the hospital, discharge time is determined by your treatment team.   Patients discharged the day of surgery will not be allowed to drive home.  You will need someone to drive you home and stay with you the night of your procedure.     Please read over the following fact sheets that you were given:      _x___ [place] WITH A SMALL SIP OF WATER. These include:  1. DETROL  2.  3.  4.  5.  6.  ____Fleets enema or Magnesium Citrate as directed.   _x___ Use CHG Soap or sage wipes as directed on instruction sheet   ____ Use inhalers on the day of surgery and bring to hospital day of surgery  ____ Stop Metformin and Janumet 2 days prior to surgery.    ____ Take 1/2 of usual insulin dose the night before surgery and none on the morning surgery.   _x___ Follow recommendations from Cardiologist, Pulmonologist or PCP regarding stopping Aspirin, Coumadin, Plavix ,Eliquis, Effient, or Pradaxa, and Pletal-PT STOPPED 81 MG ASPIRIN ON 02-12-17  X____Stop Anti-inflammatories such as Advil, Aleve, Ibuprofen, Motrin, Naproxen, Naprosyn, Goodies powders or aspirin products NOW-OK to take Tylenol OR TYLENOL ARTHRITIS IF NEEDED   _x___ Stop supplements until after surgery-STOP BIOTIN NOW-MAY RESUME AFTER SURGERY   ____ Bring C-Pap to the hospital.

## 2017-02-16 ENCOUNTER — Encounter
Admission: RE | Admit: 2017-02-16 | Discharge: 2017-02-16 | Disposition: A | Discharge status: Home / Self Care | Payer: Medicare Other | Source: Ambulatory Visit | Attending: Surgery | Admitting: Surgery

## 2017-02-16 DIAGNOSIS — Z01810 Encounter for preprocedural cardiovascular examination: Secondary | ICD-10-CM | POA: Diagnosis present

## 2017-02-16 DIAGNOSIS — R011 Cardiac murmur, unspecified: Secondary | ICD-10-CM | POA: Diagnosis not present

## 2017-02-20 ENCOUNTER — Ambulatory Visit: Payer: Medicare Other | Admitting: Anesthesiology

## 2017-02-20 ENCOUNTER — Ambulatory Visit: Payer: Medicare Other

## 2017-02-20 ENCOUNTER — Ambulatory Visit
Admission: RE | Admit: 2017-02-20 | Discharge: 2017-02-20 | Disposition: A | Discharge status: Home / Self Care | Payer: Medicare Other | Source: Ambulatory Visit | Attending: Surgery | Admitting: Surgery

## 2017-02-20 ENCOUNTER — Encounter: Payer: Self-pay | Admitting: Emergency Medicine

## 2017-02-20 ENCOUNTER — Encounter
Admission: RE | Disposition: A | Discharge status: Home / Self Care | Payer: Self-pay | Source: Ambulatory Visit | Attending: Surgery

## 2017-02-20 DIAGNOSIS — N63 Unspecified lump in unspecified breast: Secondary | ICD-10-CM

## 2017-02-20 DIAGNOSIS — N6021 Fibroadenosis of right breast: Secondary | ICD-10-CM | POA: Diagnosis not present

## 2017-02-20 DIAGNOSIS — Z7982 Long term (current) use of aspirin: Secondary | ICD-10-CM | POA: Insufficient documentation

## 2017-02-20 DIAGNOSIS — N6310 Unspecified lump in the right breast, unspecified quadrant: Secondary | ICD-10-CM | POA: Diagnosis present

## 2017-02-20 DIAGNOSIS — N393 Stress incontinence (female) (male): Secondary | ICD-10-CM | POA: Insufficient documentation

## 2017-02-20 DIAGNOSIS — E785 Hyperlipidemia, unspecified: Secondary | ICD-10-CM | POA: Insufficient documentation

## 2017-02-20 DIAGNOSIS — Z85828 Personal history of other malignant neoplasm of skin: Secondary | ICD-10-CM | POA: Insufficient documentation

## 2017-02-20 DIAGNOSIS — Z791 Long term (current) use of non-steroidal anti-inflammatories (NSAID): Secondary | ICD-10-CM | POA: Insufficient documentation

## 2017-02-20 DIAGNOSIS — Z79899 Other long term (current) drug therapy: Secondary | ICD-10-CM | POA: Diagnosis not present

## 2017-02-20 HISTORY — PX: BREAST EXCISIONAL BIOPSY: SUR124

## 2017-02-20 HISTORY — PX: BREAST LUMPECTOMY WITH NEEDLE LOCALIZATION: SHX5759

## 2017-02-20 HISTORY — PX: BREAST LUMPECTOMY: SHX2

## 2017-02-20 SURGERY — BREAST LUMPECTOMY WITH NEEDLE LOCALIZATION
Anesthesia: General | Laterality: Right | Wound class: Clean

## 2017-02-20 MED ORDER — PROPOFOL 10 MG/ML IV BOLUS
INTRAVENOUS | Status: AC
  Filled 2017-02-20: qty 20

## 2017-02-20 MED ORDER — DEXAMETHASONE SODIUM PHOSPHATE 10 MG/ML IJ SOLN
INTRAMUSCULAR | Status: DC | PRN
  Administered 2017-02-20: 5 mg via INTRAVENOUS

## 2017-02-20 MED ORDER — FENTANYL CITRATE (PF) 100 MCG/2ML IJ SOLN
INTRAMUSCULAR | Status: AC
  Filled 2017-02-20: qty 2

## 2017-02-20 MED ORDER — BUPIVACAINE-EPINEPHRINE (PF) 0.5% -1:200000 IJ SOLN
INTRAMUSCULAR | Status: AC
  Filled 2017-02-20: qty 30

## 2017-02-20 MED ORDER — HYDROCODONE-ACETAMINOPHEN 5-325 MG PO TABS: 1.0000 | ORAL_TABLET | Freq: Four times a day (QID) | ORAL | 0 refills | Status: DC | PRN

## 2017-02-20 MED ORDER — HYDROCODONE-ACETAMINOPHEN 5-325 MG PO TABS: 1.0000 | ORAL_TABLET | ORAL | Status: DC | PRN

## 2017-02-20 MED ORDER — OXYCODONE HCL 5 MG PO TABS: 5.0000 mg | ORAL_TABLET | Freq: Once | ORAL | Status: DC | PRN

## 2017-02-20 MED ORDER — MEPERIDINE HCL 50 MG/ML IJ SOLN: 6.2500 mg | INTRAMUSCULAR | Status: DC | PRN

## 2017-02-20 MED ORDER — FAMOTIDINE 20 MG PO TABS
ORAL_TABLET | ORAL | Status: AC
  Administered 2017-02-20: 20 mg via ORAL
  Filled 2017-02-20: qty 1

## 2017-02-20 MED ORDER — ONDANSETRON HCL 4 MG/2ML IJ SOLN
INTRAMUSCULAR | Status: DC | PRN
  Administered 2017-02-20: 4 mg via INTRAVENOUS

## 2017-02-20 MED ORDER — FENTANYL CITRATE (PF) 100 MCG/2ML IJ SOLN
25.0000 ug | INTRAMUSCULAR | Status: DC | PRN
  Administered 2017-02-20 (×2): 50 ug via INTRAVENOUS

## 2017-02-20 MED ORDER — FENTANYL CITRATE (PF) 100 MCG/2ML IJ SOLN
INTRAMUSCULAR | Status: DC | PRN
  Administered 2017-02-20 (×2): 25 ug via INTRAVENOUS
  Administered 2017-02-20: 50 ug via INTRAVENOUS

## 2017-02-20 MED ORDER — PROMETHAZINE HCL 25 MG/ML IJ SOLN: 6.2500 mg | INTRAMUSCULAR | Status: DC | PRN

## 2017-02-20 MED ORDER — HYDROMORPHONE HCL 1 MG/ML IJ SOLN
INTRAMUSCULAR | Status: AC
  Filled 2017-02-20: qty 1

## 2017-02-20 MED ORDER — BUPIVACAINE-EPINEPHRINE 0.5% -1:200000 IJ SOLN
INTRAMUSCULAR | Status: DC | PRN
  Administered 2017-02-20: 10 mL

## 2017-02-20 MED ORDER — LACTATED RINGERS IV SOLN
INTRAVENOUS | Status: DC
  Administered 2017-02-20: 12:00:00 via INTRAVENOUS

## 2017-02-20 MED ORDER — MIDAZOLAM HCL 2 MG/2ML IJ SOLN
INTRAMUSCULAR | Status: AC
  Filled 2017-02-20: qty 2

## 2017-02-20 MED ORDER — FAMOTIDINE 20 MG PO TABS
20.0000 mg | ORAL_TABLET | Freq: Once | ORAL | Status: AC
  Administered 2017-02-20: 20 mg via ORAL

## 2017-02-20 MED ORDER — OXYCODONE HCL 5 MG/5ML PO SOLN: 5.0000 mg | Freq: Once | ORAL | Status: DC | PRN

## 2017-02-20 MED ORDER — ONDANSETRON HCL 4 MG/2ML IJ SOLN
INTRAMUSCULAR | Status: AC
  Filled 2017-02-20: qty 2

## 2017-02-20 MED ORDER — PROPOFOL 10 MG/ML IV BOLUS
INTRAVENOUS | Status: DC | PRN
  Administered 2017-02-20: 120 mg via INTRAVENOUS

## 2017-02-20 MED ORDER — DEXAMETHASONE SODIUM PHOSPHATE 10 MG/ML IJ SOLN
INTRAMUSCULAR | Status: AC
  Filled 2017-02-20: qty 1

## 2017-02-20 MED ORDER — LACTATED RINGERS IV SOLN
INTRAVENOUS | Status: DC | PRN
  Administered 2017-02-20: 13:00:00 via INTRAVENOUS

## 2017-02-20 MED ORDER — PHENYLEPHRINE HCL 10 MG/ML IJ SOLN
INTRAMUSCULAR | Status: DC | PRN
  Administered 2017-02-20: 50 ug via INTRAVENOUS

## 2017-02-20 SURGICAL SUPPLY — 27 items
BLADE SURG 15 STRL LF DISP TIS (BLADE) ×1 IMPLANT
BLADE SURG 15 STRL SS (BLADE) ×1
CANISTER SUCT 1200ML W/VALVE (MISCELLANEOUS) ×2 IMPLANT
CHLORAPREP W/TINT 26ML (MISCELLANEOUS) ×2 IMPLANT
DERMABOND ADVANCED (GAUZE/BANDAGES/DRESSINGS) ×1
DERMABOND ADVANCED .7 DNX12 (GAUZE/BANDAGES/DRESSINGS) ×1 IMPLANT
DEVICE DUBIN SPECIMEN MAMMOGRA (MISCELLANEOUS) ×2 IMPLANT
DRAPE LAPAROTOMY 77X122 PED (DRAPES) ×2 IMPLANT
ELECT REM PT RETURN 9FT ADLT (ELECTROSURGICAL) ×2
ELECTRODE REM PT RTRN 9FT ADLT (ELECTROSURGICAL) ×1 IMPLANT
GLOVE BIO SURGEON STRL SZ7.5 (GLOVE) ×4 IMPLANT
GOWN STRL REUS W/ TWL LRG LVL3 (GOWN DISPOSABLE) ×2 IMPLANT
GOWN STRL REUS W/TWL LRG LVL3 (GOWN DISPOSABLE) ×2
KIT RM TURNOVER STRD PROC AR (KITS) ×2 IMPLANT
LABEL OR SOLS (LABEL) ×2 IMPLANT
MARGIN MAP 10MM (MISCELLANEOUS) ×2 IMPLANT
NEEDLE HYPO 25X1 1.5 SAFETY (NEEDLE) ×2 IMPLANT
PACK BASIN MINOR ARMC (MISCELLANEOUS) ×2 IMPLANT
SUT CHROMIC 3 0 SH 27 (SUTURE) IMPLANT
SUT CHROMIC 4 0 RB 1X27 (SUTURE) ×2 IMPLANT
SUT ETHILON 3-0 FS-10 30 BLK (SUTURE) ×2
SUT MNCRL 4-0 (SUTURE) ×1
SUT MNCRL 4-0 27XMFL (SUTURE) ×1
SUTURE EHLN 3-0 FS-10 30 BLK (SUTURE) ×1 IMPLANT
SUTURE MNCRL 4-0 27XMF (SUTURE) ×1 IMPLANT
SYRINGE 10CC LL (SYRINGE) ×2 IMPLANT
WATER STERILE IRR 1000ML POUR (IV SOLUTION) ×2 IMPLANT

## 2017-02-20 NOTE — H&P (Signed)
She comes for excision of 2 right breast masses with recent Bx findings of radial scar.  She reports no change in condition since office exam.  Mammogram images viewed.  Right side marked YES.  Labs noted.  Discussed plan for surgery

## 2017-02-20 NOTE — Anesthesia Procedure Notes (Deleted)
Procedure Name: LMA Insertion Date/Time: 02/20/2017 1:18 PM Performed by: [facility] Pre-anesthesia Checklist: Patient identified, Patient being monitored, Timeout performed, Emergency Drugs available and Suction available Patient Re-evaluated:Patient Re-evaluated prior to induction Oxygen Delivery Method: Circle system utilized Preoxygenation: Pre-oxygenation with 100% oxygen Induction Type: IV induction Ventilation: Mask ventilation without difficulty LMA: LMA inserted LMA Size: 3.5 Tube type: Oral Number of attempts: 1 Placement Confirmation: positive ETCO2 and breath sounds checked- equal and bilateral Tube secured with: Tape Dental Injury: Teeth and Oropharynx as per pre-operative assessment

## 2017-02-20 NOTE — Anesthesia Procedure Notes (Addendum)
Procedure Name: LMA Insertion Date/Time: 02/20/2017 1:18 PM Performed by: [facility] Pre-anesthesia Checklist: Patient identified, Patient being monitored, Timeout performed, Emergency Drugs available and Suction available Patient Re-evaluated:Patient Re-evaluated prior to induction Oxygen Delivery Method: Circle system utilized Preoxygenation: Pre-oxygenation with 100% oxygen Induction Type: IV induction Ventilation: Mask ventilation without difficulty LMA: LMA inserted Tube type: Oral Number of attempts: 1 Placement Confirmation: positive ETCO2 and breath sounds checked- equal and bilateral Tube secured with: Tape Dental Injury: Teeth and Oropharynx as per pre-operative assessment

## 2017-02-20 NOTE — Op Note (Signed)
OPERATIVE REPORT  PREOPERATIVE  DIAGNOSIS: . Two right breast masses  POSTOPERATIVE DIAGNOSIS: .Tow right breast masses  PROCEDURE: . Excision two right breast masses  ANESTHESIA:  General  SURGEON: Rochel Brome  MD   INDICATIONS: . She had recent mammograms demonstrating distortion in the upper central aspect of the right breast. Ultrasound demonstrated dense fibroglandular tissue. She had stereotactic biopsy of 2 sites demonstrating radial scar and usual ductal hyperplasia. Excision of the 2 sites were recommended for further evaluation and treatment. She did have preoperative x-ray needle localization with insertion of Kopan's wires adjacent to the biopsy markers. Mammogram images were reviewed prior to incision.  With the patient on the operating table in the supine position under general anesthesia the right arm was placed on a lateral arm support. The dressing was removed from the right breast exposing 2 Kopan's wires which entered the medial aspect of the breast. Wires were cut 2 cm from the skin. The breast and surrounding chest wall were prepared with ChloraPrep and draped in a sterile manner.  A curvilinear incision was made about 6 mm outside the border of the areola from approximately 1:00 position to 4:00 position and carried down through subcutaneous tissues. Electrocautery was used for hemostasis. The 2 Kopan's wires were encountered. The mass of tissue surrounding the upper central wire were was excised first. There was firmness within the tissue. The tissue was marked with margin maps suturing markers to label the cranial caudal medial lateral and deep margins. This was submitted for specimen mammogram which did demonstrate the biopsy marker within the specimen. This has subsequently been submitted for routine pathology.  Next the more central breast mass was removed surrounding the lower central wire. There was also firmness in the specimen. Markers were likewise sutured to the  medial lateral cranial caudal and deep margins for the pathologist orientation. Specimen mammogram did demonstrate the location of the biopsy marker within the specimen. This has been submitted for routine pathology.  The wound was further inspected and several tiny bleeding points were cauterized hemostasis was surgically intact. Subcuticular tissues were infiltrated with half percent Sensorcaine with epinephrine. The subcutaneous tissues were approximated with interrupted 3-0 chromic. The skin was closed with running 4-0 Monocryl subcuticular suture and Dermabond. The patient tolerated surgery satisfactorily and was then prepared for transfer to the recovery room  [facility].D.

## 2017-02-20 NOTE — Anesthesia Post-op Follow-up Note (Signed)
Anesthesia QCDR form completed.        

## 2017-02-20 NOTE — Anesthesia Preprocedure Evaluation (Signed)
Anesthesia Evaluation  Patient identified by MRN, date of birth, ID band Patient awake    Reviewed: Allergy & Precautions, NPO status , Patient's Chart, lab work & pertinent test results  History of Anesthesia Complications Negative for: history of anesthetic complications  Airway Mallampati: II  TM Distance: >3 FB Neck ROM: Full    Dental  (+) Implants   Pulmonary neg pulmonary ROS, neg sleep apnea, neg COPD,    breath sounds clear to auscultation- rhonchi (-) wheezing      Cardiovascular Exercise Tolerance: Good (-) hypertension(-) CAD, (-) Past MI and (-) Cardiac Stents  Rhythm:Regular Rate:Normal - Systolic murmurs and - Diastolic murmurs    Neuro/Psych negative neurological ROS  negative psych ROS   GI/Hepatic negative GI ROS, Neg liver ROS,   Endo/Other  negative endocrine ROSneg diabetes  Renal/GU negative Renal ROS     Musculoskeletal  (+) Arthritis ,   Abdominal (+) - obese,   Peds  Hematology negative hematology ROS (+)   Anesthesia Other Findings Past Medical History: 02/12/2015: Allergic rhinitis No date: Arthritis     Comment:  osteoarthritis 02/12/2015: Arthritis, degenerative 10/25/2012: Basal cell carcinoma of cheek     Comment:  Overview:  Basal cell carcinoma of left medial cheek               treated with Mohs surgery on 10/25/2012.  01/28/2012: Basal cell carcinoma of face     Comment:  Overview:  BCC of left medial cheek treated with Mohs               surgery 01-28-12  02/12/2015: CA skin, basal cell 02/12/2015: Climacteric No date: DDD (degenerative disc disease), cervical 02/12/2015: Edema, peripheral 02/12/2015: Female genuine stress incontinence No date: Heart murmur 02/12/2015: HLD (hyperlipidemia) No date: Hyperlipidemia 02/12/2015: Injury of eyeball     Comment:  Overview:  LEFT RETINAL TRAUMA  No date: Inverted nipple     Comment:  left nipple inverted, nothing new 02/12/2015:  Osteopenia No date: Skin cancer, basal cell 02/12/2015: Stasis, venous   Reproductive/Obstetrics                             Anesthesia Physical Anesthesia Plan  ASA: II  Anesthesia Plan: General   Post-op Pain Management:    Induction: Intravenous  PONV Risk Score and Plan: 2 and Ondansetron and Dexamethasone  Airway Management Planned: LMA  Additional Equipment:   Intra-op Plan:   Post-operative Plan:   Informed Consent: I have reviewed the patients History and Physical, chart, labs and discussed the procedure including the risks, benefits and alternatives for the proposed anesthesia with the patient or authorized representative who has indicated his/her understanding and acceptance.   Dental advisory given  Plan Discussed with: CRNA and Anesthesiologist  Anesthesia Plan Comments:         Anesthesia Quick Evaluation

## 2017-02-20 NOTE — Transfer of Care (Signed)
Immediate Anesthesia Transfer of Care Note  Patient: Tabitha Rice  Procedure(s) Performed: EXCISION OF RIGHT BREAST MASS WITH NEEDLE LOCALIZATION (Right )  Patient Location: PACU  Anesthesia Type:General  Level of Consciousness: sedated  Airway & Oxygen Therapy: Patient Spontanous Breathing  Post-op Assessment: Report given to RN and Post -op Vital signs reviewed and stable  Post vital signs: Reviewed and stable  Last Vitals:  Vitals:   02/20/17 1210  BP: 140/60  Pulse: 80  Resp: 18  Temp: 37.1 C  SpO2: 100%    Last Pain:  Vitals:   02/20/17 1210  TempSrc: Oral         Complications: No apparent anesthesia complications

## 2017-02-20 NOTE — Discharge Instructions (Addendum)
Take Tylenol or Norco if needed for pain.  Should not drive or do anything dangerous when taking Norco.  May also take ibuprofen if needed for pain.  May shower and blot dry.  Were bra as desired for comfort and support.   AMBULATORY SURGERY  DISCHARGE INSTRUCTIONS   1) The drugs that you were given will stay in your system until tomorrow so for the next 24 hours you should not:  A) Drive an automobile B) Make any legal decisions C) Drink any alcoholic beverage   2) You may resume regular meals tomorrow.  Today it is better to start with liquids and gradually work up to solid foods.  You may eat anything you prefer, but it is better to start with liquids, then soup and crackers, and gradually work up to solid foods.   3) Please notify your doctor immediately if you have any unusual bleeding, trouble breathing, redness and pain at the surgery site, drainage, fever, or pain not relieved by medication.   4) Additional Instructions: Do NOT pick at or remove skin glue from incision.

## 2017-02-20 NOTE — OR Nursing (Signed)
Dr. Tamala Julian into see pt and family.  OK to dc home.

## 2017-02-23 ENCOUNTER — Encounter: Payer: Self-pay | Admitting: Surgery

## 2017-02-23 LAB — SURGICAL PATHOLOGY

## 2017-02-24 NOTE — Anesthesia Postprocedure Evaluation (Signed)
Anesthesia Post Note  Patient: Tabitha Rice  Procedure(s) Performed: EXCISION OF RIGHT BREAST MASS WITH NEEDLE LOCALIZATION (Right )  Patient location during evaluation: PACU Anesthesia Type: General Level of consciousness: awake and alert and oriented Pain management: pain level controlled Vital Signs Assessment: post-procedure vital signs reviewed and stable Respiratory status: spontaneous breathing, nonlabored ventilation and respiratory function stable Cardiovascular status: blood pressure returned to baseline and stable Postop Assessment: no signs of nausea or vomiting Anesthetic complications: no     Last Vitals:  Vitals:   02/20/17 1602 02/20/17 1637  BP: 130/60 (!) 135/54  Pulse: 64 79  Resp: 18   Temp: 36.5 C   SpO2: 98% 98%    Last Pain:  Vitals:   02/23/17 0817  TempSrc:   PainSc: 0-No pain                 Jullia Mulligan

## 2017-03-18 ENCOUNTER — Inpatient Hospital Stay: Payer: Medicare Other

## 2017-03-18 ENCOUNTER — Encounter: Payer: Self-pay | Admitting: Hematology and Oncology

## 2017-03-18 ENCOUNTER — Inpatient Hospital Stay: Payer: Medicare Other | Attending: Hematology and Oncology | Admitting: Hematology and Oncology

## 2017-03-18 VITALS — BP 159/70 | HR 70 | Temp 98.4°F | Resp 18 | Ht 63.5 in | Wt 134.7 lb

## 2017-03-18 DIAGNOSIS — M1990 Unspecified osteoarthritis, unspecified site: Secondary | ICD-10-CM | POA: Diagnosis not present

## 2017-03-18 DIAGNOSIS — Z7982 Long term (current) use of aspirin: Secondary | ICD-10-CM | POA: Diagnosis not present

## 2017-03-18 DIAGNOSIS — R609 Edema, unspecified: Secondary | ICD-10-CM | POA: Insufficient documentation

## 2017-03-18 DIAGNOSIS — M5030 Other cervical disc degeneration, unspecified cervical region: Secondary | ICD-10-CM | POA: Diagnosis not present

## 2017-03-18 DIAGNOSIS — Z85828 Personal history of other malignant neoplasm of skin: Secondary | ICD-10-CM

## 2017-03-18 DIAGNOSIS — R490 Dysphonia: Secondary | ICD-10-CM

## 2017-03-18 DIAGNOSIS — Z9049 Acquired absence of other specified parts of digestive tract: Secondary | ICD-10-CM | POA: Insufficient documentation

## 2017-03-18 DIAGNOSIS — M25511 Pain in right shoulder: Secondary | ICD-10-CM | POA: Insufficient documentation

## 2017-03-18 DIAGNOSIS — I878 Other specified disorders of veins: Secondary | ICD-10-CM | POA: Diagnosis not present

## 2017-03-18 DIAGNOSIS — N6091 Unspecified benign mammary dysplasia of right breast: Secondary | ICD-10-CM

## 2017-03-18 DIAGNOSIS — N393 Stress incontinence (female) (male): Secondary | ICD-10-CM | POA: Diagnosis not present

## 2017-03-18 DIAGNOSIS — M25512 Pain in left shoulder: Secondary | ICD-10-CM | POA: Diagnosis not present

## 2017-03-18 DIAGNOSIS — D649 Anemia, unspecified: Secondary | ICD-10-CM | POA: Insufficient documentation

## 2017-03-18 DIAGNOSIS — E785 Hyperlipidemia, unspecified: Secondary | ICD-10-CM | POA: Insufficient documentation

## 2017-03-18 DIAGNOSIS — R011 Cardiac murmur, unspecified: Secondary | ICD-10-CM | POA: Diagnosis not present

## 2017-03-18 DIAGNOSIS — Z79899 Other long term (current) drug therapy: Secondary | ICD-10-CM | POA: Diagnosis not present

## 2017-03-18 DIAGNOSIS — M8580 Other specified disorders of bone density and structure, unspecified site: Secondary | ICD-10-CM | POA: Diagnosis not present

## 2017-03-18 LAB — DAT, POLYSPECIFIC AHG (ARMC ONLY): Polyspecific AHG test: NEGATIVE

## 2017-03-18 LAB — RETICULOCYTES
RBC.: 3.69 MIL/uL — AB (ref 3.80–5.20)
RETIC CT PCT: 1.4 % (ref 0.4–3.1)
Retic Count, Absolute: 51.7 10*3/uL (ref 19.0–183.0)

## 2017-03-18 LAB — FOLATE: Folate: 12.9 ng/mL (ref >5.9)

## 2017-03-18 LAB — SEDIMENTATION RATE: SED RATE: 116 mm/h — AB (ref 0–30)

## 2017-03-18 NOTE — Progress Notes (Signed)
[facility] day:  03/18/2017  Chief Complaint: Tabitha Rice is a 71 y.o. female with atypical ductal hyperplasia who is referred by Dr. Fulton Reek in consultation for assessment and management.  HPI: Patient notes that she underwent a LEFT breast biopsy x 2 years ago in [place]. She ultimately had surgery and pathology returned as benign.  Bilateral screening mammogram on 12/22/2016 revealed possible asymmetry and distortion in the right breast. There were no suspicious findings in the left breast.  Diagnostic right mammogram on 01/07/2017 revealed persistent areas of distortion in the central portion of the right breast, 2 of which appear most discrete.  There was no sonographic correlate to for the mammographic findings. Biopsy was recommended.  She underwent stereotactic biopsy of the 2 sites on 01/13/2017.  The superior site revealed radial scar with usual ductal hyperplasia, negative for atypia and malignancy. The inferior site was also a radial scar with usual ductal hyperplasia, negative for atypia and malignancy.  She underwent excision of the 2 right breast lesions on 02/20/2017 by Dr. Rochel Brome.  The upper right lesion revealed biopsy cavity with residual changes of radial scar. There was columnar cell change with calcification. There was sclerosing adenosis, negative for atypia and malignancy.  The right central breast lesion revealed a small focus of atypical ductal hyperplasia (ADH), margins were negative. There is biopsy cavity with residual changes of radial scar. There was columnar cell changes with calcification and sclerosing adenosis. There was no malignancy.  Patient does make mention of the fact that she had some imaging last year that demonstrated a "shadow". An EUS was done to further assess, and patient was told that she had a "slow growing cancer".  Patient underwent a pancreaticoduodenectomy (Whipple procedure) on  09/19/2015. Pathology returned negative for malignancy. Patient last had a colonoscopy x 4 years ago. She notes that she is due next year. She recently completed guaiac cards x 3 at [facility] due to progressive anemia. Of note, results from FOBT testing are not available for review.   Patient has a recent history of anemia. CBC on 03/17/2016 revealed a hematocrit 36.6, hemoglobin 12.2, and MCV 86.7. CBC on 09/08/2016 revealed a hematocrit 38.8, hemoglobin 13.2, and MCV 86.4. CBC on 02/09/2017 revealed a hematocrit of 31.4, hemoglobin 9.7, MCV 87.5, and platelets 542,000. CBC on 03/11/2017 revealed a hemoglobin of 8.5, hematocrit 27.3, MCV 80.3, MCH 25.0, and platelet count of 536,000.  Symptomatically, patient feeling "pretty good". She has had some pain in her shoulders bilaterally. Patient states, "I pulled a tendon in my neck and shoulders". Patient was seen to orthopedics for evaluation. She was seen in consult on 03/17/2017 and received cortisone injections. She is scheduled to start short term physical therapy tomorrow. Patient denies any breast concerns. She has had no B symptoms or recent infections. Patient is eating well, and denies any significant weight loss. She is noted to have a hoarse voice quality in clinic today. Patient states, "When I get stressed gets dry. This is normal". Patient denies any abdominal, respiratory, or urinary symptoms.    She denies fatigue. She is on oral iron supplementation three times a day. Her diet consists of daily meat intake, however she does not eat green leaf vegetables reguarly. She denies ice pica. She has not experienced any restless leg symptoms. TSH on 03/11/2017 was normal at 3.203. UA was negative for blood. B12 level was borderline low at 385.   Menarche was at the  age of 75. Patient is a G2P2, rearing her first child at the age of 43. She did not breastfeed her children. She never used birth control pills. Patient had a partial hysterectomy in  1982, followed by 2-3 years of hormone replacement therapy. Patient performs monthly self breast examinations.    Past Medical History:  Diagnosis Date  . Allergic rhinitis 02/12/2015  . Arthritis    osteoarthritis  . Arthritis, degenerative 02/12/2015  . Basal cell carcinoma of cheek 10/25/2012   Overview:  Basal cell carcinoma of left medial cheek treated with Mohs surgery on 10/25/2012.   Marland Kitchen Basal cell carcinoma of face 01/28/2012   Overview:  BCC of left medial cheek treated with Mohs surgery 01-28-12   . CA skin, basal cell 02/12/2015  . Climacteric 02/12/2015  . DDD (degenerative disc disease), cervical   . Edema, peripheral 02/12/2015  . Female genuine stress incontinence 02/12/2015  . Heart murmur   . HLD (hyperlipidemia) 02/12/2015  . Hyperlipidemia   . Injury of eyeball 02/12/2015   Overview:  LEFT RETINAL TRAUMA   . Inverted nipple    left nipple inverted, nothing new  . Osteopenia 02/12/2015  . Skin cancer, basal cell   . Stasis, venous 02/12/2015    Past Surgical History:  Procedure Laterality Date  . ABDOMINAL HYSTERECTOMY  1982  . APPENDECTOMY    . BREAST BIOPSY Left 11/23/2014   Procedure: BREAST BIOPSY WITH NEEDLE LOCALIZATION;  Surgeon: Leonie Green, MD;  Location: [facility] ORS;  Service: General;  Laterality: Left;  . BREAST BIOPSY Left 09/25/2014   complex sclerosing lesion  . BREAST BIOPSY Right 01/13/2017   2 area/path pending  . BREAST EXCISIONAL BIOPSY Right 02/20/2017   path pending  . BREAST LUMPECTOMY Left 11/23/2014   complex sclerosing lesion excised.  Marland Kitchen BREAST LUMPECTOMY WITH NEEDLE LOCALIZATION Right 02/20/2017   Procedure: EXCISION OF RIGHT BREAST MASS WITH NEEDLE LOCALIZATION;  Surgeon: Leonie Green, MD;  Location: [facility] ORS;  Service: General;  Laterality: Right;  . CHOLECYSTECTOMY    . DILATION AND CURETTAGE OF UTERUS    . Partial hystrectomy  1982  . WHIPPLE PROCEDURE  2017   thought pt had a slow growing pancreatic mass but it came back  benign    Family History  Problem Relation Age of Onset  . Lung cancer Mother   . Cancer Mother   . Stomach cancer Father   . CVA Father   . Cancer Father   . Lung cancer Brother   . Cancer Brother   . Bladder Cancer Neg Hx   . Prostate cancer Neg Hx   . Breast cancer Neg Hx     Social History:  reports that she has never smoked. She has never used smokeless tobacco. She reports that she does not drink alcohol or use drugs.  Patient has never smoked or used alcohol. She denies any known exposures to radiations or toxins. 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 She lives in [place]. The patient is alone today.  Allergies:  Allergies  Allergen Reactions  . Ambien [Zolpidem] Itching and Rash  . Penicillin G Rash  . Penicillins Itching and Rash    Has patient had a PCN reaction causing immediate rash, facial/tongue/throat swelling, SOB or lightheadedness with hypotension: No Has patient had a PCN reaction causing severe rash involving mucus membranes or skin necrosis: No Has patient had a PCN reaction that required hospitalization: No Has patient had a  PCN reaction occurring within the last 10 years: No If all of the above answers are "NO", then may proceed with Cephalosporin use.     Current Medications: Current Outpatient Prescriptions  Medication Sig Dispense Refill  . acetaminophen (TYLENOL ARTHRITIS PAIN) 650 MG CR tablet Take 650 mg by mouth every 8 (eight) hours as needed for pain.    Marland Kitchen aspirin EC 81 MG tablet Take 81 mg by mouth daily.    Marland Kitchen atorvastatin (LIPITOR) 10 MG tablet Take 10 mg by mouth daily at 6 PM.    . Cholecalciferol (D 5000) 5000 units TABS Take 5,000 Units by mouth daily.    . FEROSUL 325 (65 Fe) MG tablet Take 325 mg by mouth daily.  1  . ibuprofen (ADVIL,MOTRIN) 200 MG tablet Take 200 mg by mouth every 6 (six) hours as needed for mild pain.    . nabumetone (RELAFEN) 500 MG tablet Take 500 mg by mouth 2 (two)  times daily.  0  . TOLTERODINE TARTRATE ER PO Take 4 mg by mouth every morning.      No current facility-administered medications for this visit.     Review of Systems:  GENERAL:  Feels "pretty good".  No fevers, sweats or weight loss. PERFORMANCE STATUS (ECOG):  1 HEENT:  Hoarse voice when dry.  Vision change secondary to cataracts.  No runny nose, sore throat, mouth sores or tenderness. Lungs: No shortness of breath or cough.  No hemoptysis. Cardiac:  No chest pain, palpitations, orthopnea, or PND. GI:  No nausea, vomiting, diarrhea, constipation, melena or hematochezia. GU:  No urgency, frequency, dysuria, or hematuria. Musculoskeletal: Shoulder pain (see HPI).  No back pain. No muscle tenderness. Extremities:  No pain or swelling. Skin:  No rashes or skin changes. Neuro:  No headache, numbness or weakness, balance or coordination issues. Endocrine:  No diabetes, thyroid issues, hot flashes or night sweats. Psych:  No mood changes, depression or anxiety. Pain:  No focal pain. Review of systems:  All other systems reviewed and found to be negative.  Physical Exam: Blood pressure (!) 159/70, pulse 70, temperature 98.4 F (36.9 C), temperature source Tympanic, resp. rate 18, height 5' 3.5" (1.613 m), weight 134 lb 11.2 oz (61.1 kg). GENERAL:  Well developed, well nourished, woman sitting comfortably in the exam room in no acute distress. MENTAL STATUS:  Alert and oriented to person, place and time. HEAD:  Blonde/white hair.  Normocephalic, atraumatic, face symmetric, no Cushingoid features. EYES:  Blue eyes.  Pupils equal round and reactive to light and accomodation.  No conjunctivitis or scleral icterus. ENT:  Oropharynx clear without lesion.  Tongue normal. Mucous membranes moist.  RESPIRATORY:  Clear to auscultation without rales, wheezes or rhonchi. CARDIOVASCULAR:  Regular rate and rhythm without murmur, rub or gallop. BREAST:  Right breast with periareolar incision.  Mild  post-operative changes.  No masses, skin changes or nipple discharge.  Left breast with old semicircular scar from 12 o'clock to 3 o'clock.  Fibrocystic changes.  No masses, skin changes or nipple discharge.  ABDOMEN:  Soft, non-tender, with active bowel sounds, and no hepatosplenomegaly.  No masses. SKIN:  Pale.  No rashes, ulcers or lesions. EXTREMITIES: No edema, no skin discoloration or tenderness.  No palpable cords. LYMPH NODES: No palpable cervical, supraclavicular, axillary or inguinal adenopathy  NEUROLOGICAL: Unremarkable. PSYCH:  Appropriate.   No visits with results within 3 Day(s) from this visit.  Latest known visit with results is:  Admission on 02/20/2017, Discharged on 02/20/2017  Component Date Value Ref Range Status  . SURGICAL PATHOLOGY 02/20/2017    Final                   Value:Surgical Pathology CASE: [id number] PATIENT: Drew Privette Surgical Pathology Report     SPECIMEN SUBMITTED: A. Breast mass, right upper B. Breast mass,right  central  CLINICAL HISTORY: None provided  PRE-OPERATIVE DIAGNOSIS: Lump or mass right breast  POST-OPERATIVE DIAGNOSIS: Right breast masses     DIAGNOSIS: A. BREAST MASS, RIGHT UPPER; NEEDLE LOCALIZED EXCISION: - BIOPSY CAVITY WITH RESIDUAL CHANGES OF RADIAL SCAR. - COLUMNAR CELL CHANGE WITH CALCIFICATION. - SCLEROSING ADENOSIS. - NEGATIVE FOR ATYPIA AND MALIGNANCY.  B. BREAST MASS, RIGHT CENTRAL; NEEDLE LOCALIZED EXCISION: - SMALL FOCUS OF ATYPICAL DUCTAL HYPERPLASIA, MARGINS ARE NEGATIVE. - BIOPSY CAVITY WITH RESIDUAL CHANGES OF RADIAL SCAR. - COLUMNAR CELL CHANGE WITH CALCIFICATION. - SCLEROSING ADENOSIS. - NEGATIVE FOR MALIGNANCY.   GROSS DESCRIPTION:  A. Labeled: mass right upper breast  Time in fixative:   2:34 PM  Cold Ischemic Time: 24 minutes  Total formalin fixation time                          8 hours  Type of specimen: excision  Size of specimen: 4.0 (anterior-posterior) by 2.4  (medial-lateral) by 1.2 (superior-inferior inferior) centimeter  Location of specimen: right  Skin: not present  Direction of compression: inferior-superior  Needle localization: 1  Orientation of specimen: cranial, caudal, medial, lateral and deep metallic markers  Superior = blue Inferior = green Medial = yellow Lateral = orange Posterior = black Anterior/Superficial = red    Plane of sectioning: anterior-posterior  Biopsy site: present with metallic clip  Presence/absence of discrete mass: absent, biopsy cavity present  Size(s) of mass(es)/cavity : 1.1 x 0.8 x 1.7 cm  Description of mass(es): no mass present, biopsy cavity surrounding hemorrhage and metallic clip  Distance between masses/clips: not applicable  Distance of mass(es)/ biopsy site/clip to surgical margins: anterior-abutting, posterior-3.1 cm, inferior-abutting, superior-0.5 cm, medial-0.5 cm a                         nd lateral-abutting  Description of remainder of tissue: yellow lobulated fibrous and fatty  Other remarkable features: none noted  Tissue submitted for special investigation: not applicable  Block summary: 1-8-entirely submitted from anterior to posterior respectively (cassette 2 containing metallic clip)  B. Labeled: right simple breast mass  Time in fixative:   2:40 PM  Cold Ischemic Time: 12 minutes  Total formalin fixation 8 hours  Type of specimen: excision  Location of specimen: right  Size of specimen: 5.3 (medial-lateral) by 3.1 (superior-inferior) by 1.2 (anterior-posterior-posterior) centimeter  Skin: not present  Direction of compression: anterior-posterior  Needle localization: 1  Orientation of specimen: medial, lateral, cranial, caudal and deep metallic markers  Superior = blue Inferior = green Medial = yellow Lateral = orange Posterior = black Anterior/Superficial = red    Plane of sectioning: medial-lateral  Biopsy site:                           present with metallic clip  Presence/absence of discrete mass: no mass, biopsy site with metallic clip  Size(s) of mass(es)/biopsy site : 1.1 x 1.0 x 0.8 cm  Description of mass(es): stellate fibrous stranding and hemorrhage metallic clip  Distance of mass(es)/ biopsy site/clip to surgical margins: anterior-abutting, posterior-1.1  cm, superior-1.0 cm from inferior-0.7 m, medial-0.3 cm and lateral-2.6 cm  Description of remainder of tissue: yellow lobulated fibrous and fatty  Other remarkable features: none noted  Tissue submitted for special investigation: not applicable  Block summary: 1-10-entirely submitted from medial to lateral respectively (cassette 4 containing metallic clip) Final Diagnosis performed by Quay Burow, MD.  Electronically signed 02/23/2017 1:16:20PM    The electronic signature indicates that the named Attending Pathologist has evaluated the specimen  Technical component performed at [facility], [address] [place], [place] [address] [place]                         b: [phone] Dir: Darrick Penna. Evette Doffing, MD  Professional component performed at [facility], [facility], [place], [place], [place] [address] Lab: [phone] Dir: Dellia Nims. Rubinas, MD      Assessment:  Tabitha Rice is a 71 y.o. female with atypical ductal hyperplasia (ADH) s/p excision of the 2 right breast lesions on 02/20/2017.  The upper right lesion revealed biopsy cavity with residual changes of radial scar. There was columnar cell change with calcification. There was sclerosing adenosis, negative for atypia and malignancy.  The right central breast lesion revealed a small focus of atypical ductal hyperplasia (ADH), margins were negative. There is biopsy cavity with residual changes of radial scar.   Estimated breast cancer risk by the [facility] model is 4.6%.  She has a recent history of a normocytic anemia and associated thrombocytosis.  CBC on 03/11/2017  revealed a hemoglobin of 8.5, hematocrit 27.3, MCV 80.3, MCH 25.0, and platelet count of 536,000.  Iron saturations was low at 4% with a TIBC of 262. Ferritin was slightly low at 77.   Patient underwent a pancreaticoduodenectomy (Whipple procedure) on 09/19/2015. Pathology was negative for malignancy.  Colonoscopy was negative 4 years ago (due 2019).  Guaiac cars are pending from the [facility].  Diet is good.  She denies ice pica.  She is on oral iron 3x a day.  TSH was normal on 03/11/2017. Urinlaysis was negative for blood.  B12 level was borderline low at 385.  Symptomatically, she is doing well.  Exam reveals post-operative changes.  Plan: 1.  Labs today: ESR, retic, folate, B12, Coombs, MMA.  2.  Discuss atypical ductal hyperplasia associated with an increase of developing breast cancer.  Discuss close surveillance with exam every 6 months and annual mammogram.  Discuss screening annual breast MRI with atypical hyperplasia is only recommended with a ?20 to 25% lifetime risk of developing breast cancer using BRCAPRO or a similar model based on family history, rather than the [place].   3.  Discuss treatment options. Consider endocrine therapy (tamoxifen or raloxifene) as chemoprevention.  Aromasin or Arimidex are also options for risk reduction based on MAP.3 tral and IBIS-II trial, although not FDA approved for breast cancer risk reduction.  Estimated risk reduction of breast cancer by 7 to 9 cases per 1000 women (risk reduction of approximately 50%). Risk reduction is greatest among women with an estimated five-year risk ?3 percent based on model estimates (eg, Gail model). Tamoxifen is slightly more effective at preventing invasive breast cancer.  Discuss potential side effects. NCCN guidelines recommends tamoxifen or raloxifene for postmenopausal women whose life expectancy is greater than 10 years and who have a greater than or equal to 1.7% 5 year risk for breast cancer determined by  the Page. Patient provided with written information on the proposed  therapies (Aromosin, Arimidex, tamoxifen, and raloxifene) today for further review at home.   4.  Discuss patient's breast cancer risk assessment per the [facility] model is 4.6%.  5.  Discuss asymptomatic anemia. Labs done on 03/11/2017 at [facility] revealed a hemoglobin of 8.5, hematocrit 27.3, MCV 80.3, and platelet count of 536,000. Iron saturations low at 4% with a TIBC of 262. Ferritin slightly low at 77. Goal ferritin level for this patient is 100. Additional labs added to today's visit to further assess. Patient advised to continue oral iron supplements. She was educated on the need to take the iron with vitamin C.   6.  RTC in 1 week for MD assessment, review of labs, and decision regarding direction of therapy.     Honor Loh, NP  03/18/2017, 1:07 PM   I saw and evaluated the patient, participating in the key portions of the service and reviewing pertinent diagnostic studies and records.  I reviewed the nurse practitioner's note and agree with the findings and the plan.  The assessment and plan were discussed with the patient.  Multiple questions were asked by the patient and answered.   Nolon Stalls, MD 03/18/2017, 1:07 PM

## 2017-03-18 NOTE — Patient Instructions (Signed)
Anastrozole tablets What is this medicine? ANASTROZOLE (an AS troe zole) is used to treat breast cancer in women who have gone through menopause. Some types of breast cancer depend on estrogen to grow, and this medicine can stop tumor growth by blocking estrogen production. This medicine may be used for other purposes; ask your health care provider or pharmacist if you have questions. COMMON BRAND NAME(S): Arimidex What should I tell my health care provider before I take this medicine? They need to know if you have any of these conditions: -liver disease -an unusual or allergic reaction to anastrozole, other medicines, foods, dyes, or preservatives -pregnant or trying to get pregnant -breast-feeding How should I use this medicine? Take this medicine by mouth with a glass of water. Follow the directions on the prescription label. You can take this medicine with or without food. Take your doses at regular intervals. Do not take your medicine more often than directed. Do not stop taking except on the advice of your doctor or health care professional. Talk to your pediatrician regarding the use of this medicine in children. Special care may be needed. Overdosage: If you think you have taken too much of this medicine contact a poison control center or emergency room at once. NOTE: This medicine is only for you. Do not share this medicine with others. What if I miss a dose? If you miss a dose, take it as soon as you can. If it is almost time for your next dose, take only that dose. Do not take double or extra doses. What may interact with this medicine? Do not take this medicine with any of the following medications: -female hormones, like estrogens or progestins and birth control pills This medicine may also interact with the following medications: -tamoxifen This list may not describe all possible interactions. Give your health care provider a list of all the medicines, herbs, non-prescription  drugs, or dietary supplements you use. Also tell them if you smoke, drink alcohol, or use illegal drugs. Some items may interact with your medicine. What should I watch for while using this medicine? Visit your doctor or health care professional for regular checks on your progress. Let your doctor or health care professional know about any unusual vaginal bleeding. Do not treat yourself for diarrhea, nausea, vomiting or other side effects. Ask your doctor or health care professional for advice. What side effects may I notice from receiving this medicine? Side effects that you should report to your doctor or health care professional as soon as possible: -allergic reactions like skin rash, itching or hives, swelling of the face, lips, or tongue -any new or unusual symptoms -breathing problems -chest pain -leg pain or swelling -vomiting Side effects that usually do not require medical attention (report to your doctor or health care professional if they continue or are bothersome): -back or bone pain -cough, or throat infection -diarrhea or constipation -dizziness -headache -hot flashes -loss of appetite -nausea -sweating -weakness and tiredness -weight gain This list may not describe all possible side effects. Call your doctor for medical advice about side effects. You may report side effects to FDA at 1-800-FDA-1088. Where should I keep my medicine? Keep out of the reach of children. Store at room temperature between 20 and 25 degrees C (68 and 77 degrees F). Throw away any unused medicine after the expiration date. NOTE: This sheet is a summary. It may not cover all possible information. If you have questions about this medicine, talk to your doctor, pharmacist,   or health care provider.  2018 Elsevier/Gold Standard (2007-07-16 16:31:52) Exemestane tablets What is this medicine? EXEMESTANE (ex e MES tane) blocks the production of the hormone estrogen. Some types of breast cancer depend  on estrogen to grow, and this medicine can stop tumor growth by blocking estrogen production. This medicine is for the treatment of breast cancer in postmenopausal women only. This medicine may be used for other purposes; ask your health care provider or pharmacist if you have questions. COMMON BRAND NAME(S): Aromasin What should I tell my health care provider before I take this medicine? They need to know if you have any of these conditions: -an unusual or allergic reaction to exemestane, other medicines, foods, dyes, or preservatives -pregnant or trying to get pregnant -breast-feeding How should I use this medicine? Take this medicine by mouth with a glass of water. Follow the directions on the prescription label. Take your doses at regular intervals after a meal. Do not take your medicine more often than directed. Do not stop taking except on the advice of your doctor or health care professional. Contact your pediatrician regarding the use of this medicine in children. Special care may be needed. Overdosage: If you think you have taken too much of this medicine contact a poison control center or emergency room at once. NOTE: This medicine is only for you. Do not share this medicine with others. What if I miss a dose? If you miss a dose, take the next dose as usual. Do not try to make up the missed dose. Do not take double or extra doses. What may interact with this medicine? Do not take this medicine with any of the following medications: -female hormones, like estrogens and birth control pills This medicine may also interact with the following medications: -androstenedione -phenytoin -rifabutin, rifampin, or rifapentine -St. John's Wort This list may not describe all possible interactions. Give your health care provider a list of all the medicines, herbs, non-prescription drugs, or dietary supplements you use. Also tell them if you smoke, drink alcohol, or use illegal drugs. Some items may  interact with your medicine. What should I watch for while using this medicine? Visit your doctor or health care professional for regular checks on your progress. If you experience hot flashes or sweating while taking this medicine, avoid alcohol, smoking and drinks with caffeine. This may help to decrease these side effects. Do not become pregnant while taking this medicine or for 1 month after stopping it. Women should inform their doctor if they wish to become pregnant or think they might be pregnant. Women of child-bearing potential will need to have a negative pregnancy test before starting this medicine. There is a potential for serious side effects to an unborn child. Do not breast-feed an infant while taking this medicine or for 1 month after stopping it. Talk to your health care professional or pharmacist for more information. What side effects may I notice from receiving this medicine? Side effects that you should report to your doctor or health care professional as soon as possible: -any new or unusual symptoms -changes in vision -fever -leg or arm swelling -pain in bones, joints, or muscles -pain in hips, back, ribs, arms, shoulders, or legs Side effects that usually do not require medical attention (report to your doctor or health care professional if they continue or are bothersome): -difficulty sleeping -headache -hot flashes -sweating -unusually weak or tired This list may not describe all possible side effects. Call your doctor for medical advice about  side effects. You may report side effects to FDA at 1-800-FDA-1088. Where should I keep my medicine? Keep out of the reach of children. Store at room temperature between 15 and 30 degrees C (59 and 86 degrees F). Throw away any unused medicine after the expiration date. NOTE: This sheet is a summary. It may not cover all possible information. If you have questions about this medicine, talk to your doctor, pharmacist, or health  care provider.  2018 Elsevier/Gold Standard (2014-12-15 12:11:38) Raloxifene tablets What is this medicine? RALOXIFENE (ral OX i feen) reduces the amount of calcium lost from bones. It is used to treat and prevent osteoporosis in women who have experienced menopause. It may also help prevent invasive breast cancer in certain women who have a high risk for breast cancer. This medicine may be used for other purposes; ask your health care provider or pharmacist if you have questions. COMMON BRAND NAME(S): Evista What should I tell my health care provider before I take this medicine? They need to know if you have any of these conditions: -a history of blood clots -cancer -heart disease or recent heart attack -high levels of triglycerides (blood fat) in the blood -history of stroke -kidney disease -liver disease -premenopausal -smoke tobacco -an unusual or allergic reaction to raloxifene, other medicines, foods, dyes, or preservatives -pregnant or trying to get pregnant -breast-feeding How should I use this medicine? Take this medicine by mouth with a glass of water. Follow the directions on the prescription label. The tablets can be taken with or without food. Take your doses at regular intervals. Do not take your medicine more often than directed. A special MedGuide will be given to you by the pharmacist with each prescription and refill. Be sure to read this information carefully each time. Talk to your pediatrician regarding the use of this medicine in children. Special care may be needed. Overdosage: If you think you have taken too much of this medicine contact a poison control center or emergency room at once. NOTE: This medicine is only for you. Do not share this medicine with others. What if I miss a dose? If you miss a dose, take it as soon as you can. If it is almost time for your next dose, take only that dose. Do not take double or extra doses. What may interact with this  medicine? -cholestyramine -female hormones, like estrogens -warfarin This list may not describe all possible interactions. Give your health care provider a list of all the medicines, herbs, non-prescription drugs, or dietary supplements you use. Also tell them if you smoke, drink alcohol, or use illegal drugs. Some items may interact with your medicine. What should I watch for while using this medicine? Visit your doctor or health care professional for regular checks on your progress. Do not stop taking this medicine except on the advice of your doctor or health care professional. If you are taking this medicine to reduce your risk of getting breast cancer, you should know that this medicine does not prevent all types of breast cancer. Talk to your doctor if you have questions. This medicine does not prevent hot flashes. It may cause hot flashes in some patients at the start of therapy. You should make sure that you get enough calcium and vitamin D while you are taking this medicine. Discuss the foods you eat and the vitamins you take with your health care professional. Exercise may help to prevent bone loss. Discuss your exercise needs with your doctor or health  care professional. This medicine can rarely cause blood clots. If you are going to have surgery, tell your doctor or health care professional that you are taking this medicine. This medicine should be stopped at least 3 days before surgery. After surgery, it should be restarted only after you are walking again. It should not be restarted while you still need long periods of bed rest. You should not smoke while taking this medicine. Smoking may increase your risk of blood clots or stroke. If you have any reason to think you are pregnant; stop taking this medicine at once and contact your doctor or health care professional. Do not breast feed while taking this medicine. What side effects may I notice from receiving this medicine? Side effects  that you should report to your doctor or health care professional as soon as possible: -allergic reactions like skin rash, itching or hives, swelling of the face, lips, or tongue) -breast tissue changes or discharge -signs and symptoms of a blood clot such as breathing problems; changes in vision; chest pain; severe, sudden headache; pain, swelling, warmth in the leg; trouble speaking; sudden numbness or weakness of the face, arm or leg -signs and symptoms of a stroke like changes in vision; confusion; trouble speaking or understanding; severe headaches; sudden numbness or weakness of the face, arm or leg; trouble walking; dizziness; loss of balance or coordination -vaginal discharge that is bloody, brown, or rust Side effects that usually do not require medical attention (report to your doctor or health care professional if they continue or are bothersome): -hot flashes -joint pain -leg cramps -sweating -swelling of the ankles, feet, hands This list may not describe all possible side effects. Call your doctor for medical advice about side effects. You may report side effects to FDA at 1-800-FDA-1088. Where should I keep my medicine? Keep out of the reach of children. Store at room temperature between 15 and 30 degrees C (59 and 86 degrees F). Throw away any unused medicine after the expiration date. NOTE: This sheet is a summary. It may not cover all possible information. If you have questions about this medicine, talk to your doctor, pharmacist, or health care provider.  2018 [employer]/Gold Standard (2016-06-11 17:15:34) Tamoxifen oral solution What is this medicine? TAMOXIFEN (ta MOX i fen) blocks the effects of estrogen. It is commonly used to treat breast cancer. It is also used to decrease the chance of breast cancer coming back in women who have received treatment for the disease. It may also help prevent breast cancer in women who have a high risk of developing breast cancer. This  medicine may be used for other purposes; ask your health care provider or pharmacist if you have questions. COMMON BRAND NAME(S): Soltamox What should I tell my health care provider before I take this medicine? They need to know if you have any of these conditions: -blood clots -blood disease -cataracts or impaired eyesight -endometriosis -high calcium levels -high cholesterol -irregular menstrual cycles -liver disease -stroke -uterine fibroids -an unusual reaction to tamoxifen, other medicines, foods, dyes, or preservatives -pregnant or trying to get pregnant -breast-feeding How should I use this medicine? Take this medicine by mouth with a glass of water. Follow the directions on the prescription label. You can take it with or without food. Take your medicine at regular intervals. Do not take your medicine more often than directed. Do not stop taking except on your doctor's advice. A special MedGuide will be given to you by the pharmacist with each  prescription and refill. Be sure to read this information carefully each time. Talk to your pediatrician regarding the use of this medicine in children. While this drug may be prescribed for selected conditions, precautions do apply. Overdosage: If you think you have taken too much of this medicine contact a poison control center or emergency room at once. NOTE: This medicine is only for you. Do not share this medicine with others. What if I miss a dose? If you miss a dose, take it as soon as you can. If it is almost time for your next dose, take only that dose. Do not take double or extra doses. What may interact with this medicine? Do not take this medicine with any of the following medications: -cisapride -certain medicines for irregular heart beat like dofetilide, dronedarone, quinidine -certain medicines for fungal infection like fluconazole, posaconazole -pimozide -saquinavir -thioridazine This medicine may also interact with the  following medications: -aminoglutethimide -anastrozole -bromocriptine -chemotherapy drugs -female hormones, like estrogens and birth control pills -letrozole -medroxyprogesterone -phenobarbital -rifampin -warfarin This list may not describe all possible interactions. Give your health care provider a list of all the medicines, herbs, non-prescription drugs, or dietary supplements you use. Also tell them if you smoke, drink alcohol, or use illegal drugs. Some items may interact with your medicine. What should I watch for while using this medicine? Visit your doctor or health care professional for regular checks on your progress. You will need regular pelvic exams, breast exams, and mammograms. If you are taking this medicine to reduce your risk of getting breast cancer, you should know that this medicine does not prevent all types of breast cancer. If breast cancer or other problems occur, there is no guarantee that it will be found at an early stage. Do not become pregnant while taking this medicine or for 2 months after stopping this medicine. Stop taking this medicine if you get pregnant or think you are pregnant and contact your doctor. This medicine may harm your unborn baby. Women who can possibly become pregnant should use birth control methods that do not use hormones during tamoxifen treatment and for 2 months after therapy has stopped. Talk with your health care provider for birth control advice. Do not breast feed while taking this medicine. What side effects may I notice from receiving this medicine? Side effects that you should report to your doctor or health care professional as soon as possible: -allergic reactions like skin rash, itching or hives, swelling of the face, lips, or tongue -changes in vision -changes in your menstrual cycle -difficulty walking or talking -new breast lumps -numbness -pelvic pain or pressure -redness, blistering, peeling or loosening of the skin,  including inside the mouth -signs and symptoms of a dangerous change in heartbeat or heart rhythm like chest pain, dizziness, fast or irregular heartbeat, palpitations, feeling faint or lightheaded, falls, breathing problems -sudden chest pain -swelling, pain or tenderness in your calf or leg -unusual bruising or bleeding -vaginal discharge that is bloody, brown, or rust -weakness -yellowing of the whites of the eyes or skin Side effects that usually do not require medical attention (report to your doctor or health care professional if they continue or are bothersome): -fatigue -hair loss, although uncommon and is usually mild -headache -hot flashes -impotence (in men) -nausea, vomiting (mild) -vaginal discharge (white or clear) This list may not describe all possible side effects. Call your doctor for medical advice about side effects. You may report side effects to FDA at 1-800-FDA-1088. Where should  I keep my medicine? Keep out of the reach of children. Store in the original package at room temperature between 20 and 25 degrees C (68 and 77 degrees F). Do not store above 25 degrees C (77 degrees F). DO NOT freeze or refrigerate. Protect from light. Keep container tightly closed. Use within 3 months of opening. Throw away any unused medicine after the expiration date. NOTE: This sheet is a summary. It may not cover all possible information. If you have questions about this medicine, talk to your doctor, pharmacist, or health care provider.  2018 Elsevier/Gold Standard (2015-11-23 07:13:59)

## 2017-03-18 NOTE — Progress Notes (Signed)
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  Referred by Dr. Doy Hutching.  Patient has inflammation in her shoulders.  She had injections yesterday which gave her relief of the pain.

## 2017-03-20 LAB — METHYLMALONIC ACID, SERUM: Methylmalonic Acid, Quantitative: 163 nmol/L (ref 0–378)

## 2017-03-21 ENCOUNTER — Encounter: Payer: Self-pay | Admitting: Hematology and Oncology

## 2017-03-24 NOTE — Progress Notes (Signed)
[facility] day:  03/25/2017  Chief Complaint: Tabitha Rice is a 71 y.o. female with atypical ductal hyperplasia who is seen for 1 week assessment to discuss decision regarding direction of therapy.  HPI: Patient was last seen in the medical oncology clinic on 03/18/2017.  At that time she was seen for initial consultation.  Symptomatically, she felt "pretty good".  She did note some pain in her shoulders bilaterally, to which she attributed to a "pulled tendon in her neck and shoulders".  She had been seen in consult by orthopedics and received cortisone injections on 03/17/2017.  She was also scheduled to start physical therapy on 03/19/2017.    She was taking oral iron supplementation 3 times a day.  Her diet was "good".  She consumed meat on a daily basis, however denied regular consumption of green leafy vegetables.  Additional labs studies were done on 03/18/2017 to further assess patient's asymptomatic anemia.  DAT was negative.  Sed rate was elevated at 116 (0-30).  Reticulocyte count was 1.4%.  Folate was normal at 12.9.  MMA was normal at 163.  We discussed her diagnosis of atypical ductal hyperplasia and treatment with hormonal therapy.  Patient was given written information on Aromasin, Arimidex, tamoxifen, and Raloxifene. Patient's breast cancer risk assessment per the [facility] model was 4.6%.    Symptomatically, she is doing well. She denies any acute physical concerns today. Patient has chronic musculoskeletal pain in her neck and shoulders. She continues with physical therapy sessions. She denies bleeding. Recent guaiac cards x 3 were negative at [facility]. Patient has no breast concerns. She has experienced no infections. Patient continues to eat well. She has lost 3 pounds.    Past Medical History:  Diagnosis Date  . Allergic rhinitis 02/12/2015  . Arthritis    osteoarthritis  . Arthritis, degenerative 02/12/2015  . Basal cell  carcinoma of cheek 10/25/2012   Overview:  Basal cell carcinoma of left medial cheek treated with Mohs surgery on 10/25/2012.   Marland Kitchen Basal cell carcinoma of face 01/28/2012   Overview:  BCC of left medial cheek treated with Mohs surgery 01-28-12   . CA skin, basal cell 02/12/2015  . Climacteric 02/12/2015  . DDD (degenerative disc disease), cervical   . Edema, peripheral 02/12/2015  . Female genuine stress incontinence 02/12/2015  . Heart murmur   . HLD (hyperlipidemia) 02/12/2015  . Hyperlipidemia   . Injury of eyeball 02/12/2015   Overview:  LEFT RETINAL TRAUMA   . Inverted nipple    left nipple inverted, nothing new  . Osteopenia 02/12/2015  . Skin cancer, basal cell   . Stasis, venous 02/12/2015    Past Surgical History:  Procedure Laterality Date  . ABDOMINAL HYSTERECTOMY  1982  . APPENDECTOMY    . BREAST BIOPSY Left 09/25/2014   complex sclerosing lesion  . BREAST BIOPSY Right 01/13/2017   2 area/path pending  . BREAST EXCISIONAL BIOPSY Right 02/20/2017   path pending  . BREAST LUMPECTOMY Left 11/23/2014   complex sclerosing lesion excised.  . CHOLECYSTECTOMY    . DILATION AND CURETTAGE OF UTERUS    . Partial hystrectomy  1982  . WHIPPLE PROCEDURE  2017   thought pt had a slow growing pancreatic mass but it came back benign    Family History  Problem Relation Age of Onset  . Lung cancer Mother   . Cancer Mother   . Stomach cancer Father   . CVA Father   .  Cancer Father   . Lung cancer Brother   . Cancer Brother   . Bladder Cancer Neg Hx   . Prostate cancer Neg Hx   . Breast cancer Neg Hx     Social History:  reports that  has never smoked. she has never used smokeless tobacco. She reports that she does not drink alcohol or use drugs.  Patient has never smoked or used alcohol. She denies any known exposures to radiations or toxins. 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 She lives in [place]. The patient is alone today.  Allergies:   Allergies  Allergen Reactions  . Ambien [Zolpidem] Itching and Rash  . Penicillin G Rash  . Penicillins Itching and Rash    Has patient had a PCN reaction causing immediate rash, facial/tongue/throat swelling, SOB or lightheadedness with hypotension: No Has patient had a PCN reaction causing severe rash involving mucus membranes or skin necrosis: No Has patient had a PCN reaction that required hospitalization: No Has patient had a PCN reaction occurring within the last 10 years: No If all of the above answers are "NO", then may proceed with Cephalosporin use.     Current Medications: Current Outpatient Medications  Medication Sig Dispense Refill  . acetaminophen (TYLENOL ARTHRITIS PAIN) 650 MG CR tablet Take 650 mg by mouth every 8 (eight) hours as needed for pain.    Marland Kitchen aspirin EC 81 MG tablet Take 81 mg by mouth daily.    Marland Kitchen atorvastatin (LIPITOR) 10 MG tablet Take 10 mg by mouth daily at 6 PM.    . Cholecalciferol (D 5000) 5000 units TABS Take 5,000 Units by mouth daily.    . FEROSUL 325 (65 Fe) MG tablet Take 325 mg by mouth daily.  1  . ibuprofen (ADVIL,MOTRIN) 200 MG tablet Take 200 mg by mouth every 6 (six) hours as needed for mild pain.    . nabumetone (RELAFEN) 500 MG tablet Take 500 mg by mouth 2 (two) times daily.  0  . TOLTERODINE TARTRATE ER PO Take 4 mg by mouth every morning.      No current facility-administered medications for this visit.     Review of Systems:  GENERAL:  Feels "pretty good".  No fevers or sweats. Weight down 3 pounds. PERFORMANCE STATUS (ECOG):  1 HEENT:  Hoarse voice when dry.  Vision change secondary to cataracts.  No runny nose, sore throat, mouth sores or tenderness. Lungs: No shortness of breath or cough.  No hemoptysis. Cardiac:  No chest pain, palpitations, orthopnea, or PND. GI:  No nausea, vomiting, diarrhea, constipation, melena or hematochezia. GU:  No urgency, frequency, dysuria, or hematuria. Musculoskeletal: Shoulder pain (see HPI).   No back pain. No muscle tenderness. Extremities:  No pain or swelling. Skin:  No rashes or skin changes. Neuro:  No headache, numbness or weakness, balance or coordination issues. Endocrine:  No diabetes, thyroid issues, hot flashes or night sweats. Psych:  No mood changes, depression or anxiety. Pain:  No focal pain. Review of systems:  All other systems reviewed and found to be negative.  Physical Exam: Blood pressure (!) 155/70, pulse 71, temperature 98.5 F (36.9 C), temperature source Tympanic, resp. rate 18, weight 131 lb 13.4 oz (59.8 kg). GENERAL:  Well developed, well nourished, woman sitting comfortably in the exam room in no acute distress. MENTAL STATUS:  Alert and oriented to person, place and time. HEAD:  Blonde/white hair.  Normocephalic, atraumatic, face symmetric, no Cushingoid features.  EYES:  Blue eyes.  No conjunctivitis or scleral icterus. NEUROLOGICAL: Unremarkable. PSYCH:  Appropriate.   No visits with results within 3 Day(s) from this visit.  Latest known visit with results is:  Appointment on 03/18/2017  Component Date Value Ref Range Status  . Sed Rate 03/18/2017 116* 0 - 30 mm/hr Final  . Retic Ct Pct 03/18/2017 1.4  0.4 - 3.1 % Final  . RBC. 03/18/2017 3.69* 3.80 - 5.20 MIL/uL Final  . Retic Count, Absolute 03/18/2017 51.7  19.0 - 183.0 K/uL Final  . Folate 03/18/2017 12.9  >5.9 ng/mL Final  . Polyspecific AHG test 03/18/2017 NEG   Final  . Methylmalonic Acid, Quantitative 03/18/2017 163  0 - 378 nmol/L Final  . Disclaimer: 03/18/2017 Comment   Final   Comment: (NOTE) This test was developed and its performance characteristics determined by [facility]. It has not been cleared or approved by the Food and Drug Administration. Performed At: [facility] [address] [place], [place] [id number] Lindon Romp MD MH:[id number]     Assessment:  NELLA BOTSFORD is a 71 y.o. female with atypical ductal hyperplasia (ADH) s/p excision of the 2  right breast lesions on 02/20/2017.  The upper right lesion revealed biopsy cavity with residual changes of radial scar. There was columnar cell change with calcification. There was sclerosing adenosis, negative for atypia and malignancy.  The right central breast lesion revealed a small focus of atypical ductal hyperplasia (ADH), margins were negative. There is biopsy cavity with residual changes of radial scar.   Estimated breast cancer risk by the [facility] model is 4.6%.  She has a recent history of a normocytic anemia and associated thrombocytosis.  CBC on 03/11/2017 revealed a hemoglobin of 8.5, hematocrit 27.3, MCV 80.3, MCH 25.0, and platelet count of 536,000.  Iron saturations was low at 4% with a TIBC of 262.  Ferritin was slightly low at 77.  TSH was normal on 03/11/2017. Urinlaysis was negative for blood.  B12 level was borderline low at 385.  Normal labs on 03/18/2017 included the following: DAT, folate, and MMA.  Sed rate was elevated at 116 (0-30).  Reticulocyte count was 1.4% (inappropriately low).   Patient underwent a pancreaticoduodenectomy (Whipple procedure) on 09/19/2015. Pathology was negative for malignancy.  Colonoscopy was negative 4 years ago (due 2019).  Gu[facility]ac cards are pending from the [facility].  Diet is good.  She denies ice pica.  She is on oral iron 3 x a day.    Symptomatically, she is doing well. Patient has no acute physical concerns. Exam stable.   Plan: 1. Labs today: LFTs   2.  Review labs from 03/18/2017.   She has iron deficiency anemia.  Continue oral iron with vitamin C.  Consider IV iron if anemia does not improve.  Follow-up guaiac cards. 3.  Discuss atypical ductal hyperplasia associated with an increase of developing breast cancer.  Discuss close surveillance with exam every 6 months and annual mammogram.   4.  Discuss treatment options for ductal hyperplasia. NCCN guidelines recommends tamoxifen or Raloxifene for postmenopausal women whose life expectancy  is greater than 10 years and who have a greater than or equal to 1.7% 5 year risk for breast cancer determined by the [place]. Patient elected to pursue treatment with tamoxifen.  5.  Rx: Tamoxifen 20mg  daily (Disp # 30). 6.  Preauthorize Venofer. 7.  RTC in 1 month for MD assessment, labs (CBC with diff, CMP, ferritin - the day before), and +/-  Venofer.   Honor Loh, NP  03/25/2017, 9:23 AM   I saw and evaluated the patient, participating in the key portions of the service and reviewing pertinent diagnostic studies and records.  I reviewed the nurse practitioner's note and agree with the findings and the plan.  The assessment and plan were discussed with the patient.  Multiple questions were asked by the patient and answered.   Nolon Stalls, MD 03/25/2017, 9:23 AM

## 2017-03-25 ENCOUNTER — Inpatient Hospital Stay: Payer: Medicare Other

## 2017-03-25 ENCOUNTER — Other Ambulatory Visit: Payer: Self-pay | Admitting: *Deleted

## 2017-03-25 ENCOUNTER — Inpatient Hospital Stay: Payer: Medicare Other | Attending: Hematology and Oncology | Admitting: Hematology and Oncology

## 2017-03-25 VITALS — BP 155/70 | HR 71 | Temp 98.5°F | Resp 18 | Wt 131.8 lb

## 2017-03-25 DIAGNOSIS — Z85828 Personal history of other malignant neoplasm of skin: Secondary | ICD-10-CM | POA: Diagnosis not present

## 2017-03-25 DIAGNOSIS — D509 Iron deficiency anemia, unspecified: Secondary | ICD-10-CM | POA: Diagnosis not present

## 2017-03-25 DIAGNOSIS — Z801 Family history of malignant neoplasm of trachea, bronchus and lung: Secondary | ICD-10-CM | POA: Diagnosis not present

## 2017-03-25 DIAGNOSIS — M542 Cervicalgia: Secondary | ICD-10-CM | POA: Diagnosis not present

## 2017-03-25 DIAGNOSIS — N6091 Unspecified benign mammary dysplasia of right breast: Secondary | ICD-10-CM | POA: Diagnosis present

## 2017-03-25 DIAGNOSIS — R700 Elevated erythrocyte sedimentation rate: Secondary | ICD-10-CM | POA: Insufficient documentation

## 2017-03-25 DIAGNOSIS — M25512 Pain in left shoulder: Secondary | ICD-10-CM | POA: Insufficient documentation

## 2017-03-25 DIAGNOSIS — Z79899 Other long term (current) drug therapy: Secondary | ICD-10-CM | POA: Diagnosis not present

## 2017-03-25 DIAGNOSIS — R609 Edema, unspecified: Secondary | ICD-10-CM | POA: Diagnosis not present

## 2017-03-25 DIAGNOSIS — M25511 Pain in right shoulder: Secondary | ICD-10-CM | POA: Insufficient documentation

## 2017-03-25 DIAGNOSIS — Z800 Family history of malignant neoplasm of digestive organs: Secondary | ICD-10-CM | POA: Insufficient documentation

## 2017-03-25 DIAGNOSIS — E785 Hyperlipidemia, unspecified: Secondary | ICD-10-CM | POA: Diagnosis not present

## 2017-03-25 DIAGNOSIS — M8580 Other specified disorders of bone density and structure, unspecified site: Secondary | ICD-10-CM | POA: Insufficient documentation

## 2017-03-25 DIAGNOSIS — Z7982 Long term (current) use of aspirin: Secondary | ICD-10-CM | POA: Diagnosis not present

## 2017-03-25 DIAGNOSIS — M1990 Unspecified osteoarthritis, unspecified site: Secondary | ICD-10-CM

## 2017-03-25 DIAGNOSIS — M5030 Other cervical disc degeneration, unspecified cervical region: Secondary | ICD-10-CM | POA: Diagnosis not present

## 2017-03-25 DIAGNOSIS — Z853 Personal history of malignant neoplasm of breast: Secondary | ICD-10-CM

## 2017-03-25 LAB — HEPATIC FUNCTION PANEL
ALK PHOS: 58 U/L (ref 38–126)
ALT: 12 U/L — ABNORMAL LOW (ref 14–54)
AST: 14 U/L — ABNORMAL LOW (ref 15–41)
Albumin: 3.6 g/dL (ref 3.5–5.0)
BILIRUBIN TOTAL: 0.4 mg/dL (ref 0.3–1.2)
Bilirubin, Direct: <0.1 mg/dL — ABNORMAL LOW (ref 0.1–0.5)
TOTAL PROTEIN: 7.4 g/dL (ref 6.5–8.1)

## 2017-03-25 MED ORDER — TAMOXIFEN CITRATE 20 MG PO TABS: 20.0000 mg | ORAL_TABLET | Freq: Every day | ORAL | 0 refills | Status: DC

## 2017-03-25 NOTE — Progress Notes (Signed)
Patient here today for review of labs and treatment plan.  States she is sleeping better.  Having PT on her shoulders and the pain is much better, allowing her to sleep better.  States appetite is also improved.

## 2017-03-29 ENCOUNTER — Encounter: Payer: Self-pay | Admitting: Hematology and Oncology

## 2017-04-21 ENCOUNTER — Other Ambulatory Visit: Payer: Self-pay | Admitting: Physician Assistant

## 2017-04-21 ENCOUNTER — Other Ambulatory Visit: Payer: Self-pay | Admitting: Orthopedic Surgery

## 2017-04-21 ENCOUNTER — Inpatient Hospital Stay: Payer: Medicare Other | Attending: Hematology and Oncology

## 2017-04-21 DIAGNOSIS — M5030 Other cervical disc degeneration, unspecified cervical region: Secondary | ICD-10-CM | POA: Diagnosis not present

## 2017-04-21 DIAGNOSIS — N393 Stress incontinence (female) (male): Secondary | ICD-10-CM | POA: Insufficient documentation

## 2017-04-21 DIAGNOSIS — N6091 Unspecified benign mammary dysplasia of right breast: Secondary | ICD-10-CM | POA: Diagnosis present

## 2017-04-21 DIAGNOSIS — M1990 Unspecified osteoarthritis, unspecified site: Secondary | ICD-10-CM | POA: Diagnosis not present

## 2017-04-21 DIAGNOSIS — Z7982 Long term (current) use of aspirin: Secondary | ICD-10-CM | POA: Insufficient documentation

## 2017-04-21 DIAGNOSIS — Z801 Family history of malignant neoplasm of trachea, bronchus and lung: Secondary | ICD-10-CM | POA: Insufficient documentation

## 2017-04-21 DIAGNOSIS — M25512 Pain in left shoulder: Secondary | ICD-10-CM

## 2017-04-21 DIAGNOSIS — G8929 Other chronic pain: Secondary | ICD-10-CM | POA: Insufficient documentation

## 2017-04-21 DIAGNOSIS — Z853 Personal history of malignant neoplasm of breast: Secondary | ICD-10-CM

## 2017-04-21 DIAGNOSIS — R011 Cardiac murmur, unspecified: Secondary | ICD-10-CM | POA: Diagnosis not present

## 2017-04-21 DIAGNOSIS — R700 Elevated erythrocyte sedimentation rate: Secondary | ICD-10-CM | POA: Insufficient documentation

## 2017-04-21 DIAGNOSIS — E785 Hyperlipidemia, unspecified: Secondary | ICD-10-CM | POA: Insufficient documentation

## 2017-04-21 DIAGNOSIS — M8580 Other specified disorders of bone density and structure, unspecified site: Secondary | ICD-10-CM | POA: Insufficient documentation

## 2017-04-21 DIAGNOSIS — D509 Iron deficiency anemia, unspecified: Secondary | ICD-10-CM | POA: Diagnosis not present

## 2017-04-21 DIAGNOSIS — Z800 Family history of malignant neoplasm of digestive organs: Secondary | ICD-10-CM | POA: Diagnosis not present

## 2017-04-21 DIAGNOSIS — M25511 Pain in right shoulder: Secondary | ICD-10-CM

## 2017-04-21 DIAGNOSIS — Z79899 Other long term (current) drug therapy: Secondary | ICD-10-CM | POA: Insufficient documentation

## 2017-04-21 DIAGNOSIS — M7918 Myalgia, other site: Secondary | ICD-10-CM | POA: Insufficient documentation

## 2017-04-21 DIAGNOSIS — Z85828 Personal history of other malignant neoplasm of skin: Secondary | ICD-10-CM | POA: Diagnosis not present

## 2017-04-21 DIAGNOSIS — R609 Edema, unspecified: Secondary | ICD-10-CM | POA: Diagnosis not present

## 2017-04-21 DIAGNOSIS — Z79810 Long term (current) use of selective estrogen receptor modulators (SERMs): Secondary | ICD-10-CM | POA: Diagnosis not present

## 2017-04-21 LAB — COMPREHENSIVE METABOLIC PANEL
ALT: 10 U/L — ABNORMAL LOW (ref 14–54)
AST: 18 U/L (ref 15–41)
Albumin: 3.3 g/dL — ABNORMAL LOW (ref 3.5–5.0)
Alkaline Phosphatase: 40 U/L (ref 38–126)
Anion gap: 7 (ref 5–15)
BUN: 14 mg/dL (ref 6–20)
CO2: 23 mmol/L (ref 22–32)
Calcium: 8.3 mg/dL — ABNORMAL LOW (ref 8.9–10.3)
Chloride: 106 mmol/L (ref 101–111)
Creatinine, Ser: 0.78 mg/dL (ref 0.44–1.00)
GFR calc Af Amer: >60 mL/min (ref >60)
GFR calc non Af Amer: >60 mL/min (ref >60)
Glucose, Bld: 168 mg/dL — ABNORMAL HIGH (ref 65–99)
Potassium: 4.1 mmol/L (ref 3.5–5.1)
Sodium: 136 mmol/L (ref 135–145)
Total Bilirubin: 0.1 mg/dL — ABNORMAL LOW (ref 0.3–1.2)
Total Protein: 6.8 g/dL (ref 6.5–8.1)

## 2017-04-21 LAB — CBC WITH DIFFERENTIAL/PLATELET
Basophils Absolute: 0.1 10*3/uL (ref 0–0.1)
Basophils Relative: 1 %
Eosinophils Absolute: 0.1 10*3/uL (ref 0–0.7)
Eosinophils Relative: 1 %
HCT: 31.1 % — ABNORMAL LOW (ref 35.0–47.0)
Hemoglobin: 10 g/dL — ABNORMAL LOW (ref 12.0–16.0)
Lymphocytes Relative: 12 %
Lymphs Abs: 1 10*3/uL (ref 1.0–3.6)
MCH: 24.3 pg — ABNORMAL LOW (ref 26.0–34.0)
MCHC: 32.2 g/dL (ref 32.0–36.0)
MCV: 75.4 fL — ABNORMAL LOW (ref 80.0–100.0)
Monocytes Absolute: 0.4 10*3/uL (ref 0.2–0.9)
Monocytes Relative: 5 %
Neutro Abs: 7.1 10*3/uL — ABNORMAL HIGH (ref 1.4–6.5)
Neutrophils Relative %: 81 %
Platelets: 450 10*3/uL — ABNORMAL HIGH (ref 150–440)
RBC: 4.13 MIL/uL (ref 3.80–5.20)
RDW: 20.4 % — ABNORMAL HIGH (ref 11.5–14.5)
WBC: 8.7 10*3/uL (ref 3.6–11.0)

## 2017-04-21 LAB — FERRITIN: Ferritin: 84 ng/mL (ref 11–307)

## 2017-04-21 NOTE — Progress Notes (Signed)
[facility] day:  04/22/2017  Chief Complaint: Tabitha Rice is a 71 y.o. female with atypical ductal hyperplasia and iron deficiency anemia who is seen for 1 month assessment on tamoxifen.   HPI: Patient was last seen in the medical oncology clinic on 03/25/2017.  At that time, she was doing well. She complained of chronic musculoskeletal pain in her neck and shoulders. She was participating in physical therapy. She expressed no breast concerns. Exam was stable.  LFTs were unremarkable. She started on tamoxifen.   During her last visit, she had began oral iron supplementation with vitamin C daily. We discussed the potential need for intravenous iron replacement if her hemoglobin and ferritin did not demonstrate improvement. Labs on 04/21/2017 revealed a hemoglobin of 10.0, hematocrit 31.0, MCV 75.4, and platelets 450,000. CMP was unremarkable.  Ferritin was 84.   During the interim, she has continued to work with physical therapy. Patient has a MRIs of her bilateral shoulders scheduled on 04/29/2017 to evaluate for possible rotator cuff tears. Patient notes chronic pain in her shoulders. Pain rated 8/10 today.  Symptomatically, patient is doing well. She is tolerating tamoxifen well. She experienced some leg cramps initially, which relieved with short distance ambulation. She has not experienced any lower extremity edema.  Patient continues on supplemental oral iron with vitamin C three times a day. Patient denies breast concerns. She denies B symptoms or interval infections.    Past Medical History:  Diagnosis Date  . Allergic rhinitis 02/12/2015  . Arthritis    osteoarthritis  . Arthritis, degenerative 02/12/2015  . Basal cell carcinoma of cheek 10/25/2012   Overview:  Basal cell carcinoma of left medial cheek treated with Mohs surgery on 10/25/2012.   Marland Kitchen Basal cell carcinoma of face 01/28/2012   Overview:  BCC of left medial cheek treated with Mohs  surgery 01-28-12   . CA skin, basal cell 02/12/2015  . Climacteric 02/12/2015  . DDD (degenerative disc disease), cervical   . Edema, peripheral 02/12/2015  . Female genuine stress incontinence 02/12/2015  . Heart murmur   . HLD (hyperlipidemia) 02/12/2015  . Hyperlipidemia   . Injury of eyeball 02/12/2015   Overview:  LEFT RETINAL TRAUMA   . Inverted nipple    left nipple inverted, nothing new  . Osteopenia 02/12/2015  . Skin cancer, basal cell   . Stasis, venous 02/12/2015    Past Surgical History:  Procedure Laterality Date  . ABDOMINAL HYSTERECTOMY  1982  . APPENDECTOMY    . BREAST BIOPSY Left 11/23/2014   Procedure: BREAST BIOPSY WITH NEEDLE LOCALIZATION;  Surgeon: Leonie Green, MD;  Location: [facility] ORS;  Service: General;  Laterality: Left;  . BREAST BIOPSY Left 09/25/2014   complex sclerosing lesion  . BREAST BIOPSY Right 01/13/2017   2 area/path pending  . BREAST EXCISIONAL BIOPSY Right 02/20/2017   path pending  . BREAST LUMPECTOMY Left 11/23/2014   complex sclerosing lesion excised.  Marland Kitchen BREAST LUMPECTOMY WITH NEEDLE LOCALIZATION Right 02/20/2017   Procedure: EXCISION OF RIGHT BREAST MASS WITH NEEDLE LOCALIZATION;  Surgeon: Leonie Green, MD;  Location: [facility] ORS;  Service: General;  Laterality: Right;  . CHOLECYSTECTOMY    . DILATION AND CURETTAGE OF UTERUS    . Partial hystrectomy  1982  . WHIPPLE PROCEDURE  2017   thought pt had a slow growing pancreatic mass but it came back benign    Family History  Problem Relation Age of Onset  . Lung cancer  Mother   . Cancer Mother   . Stomach cancer Father   . CVA Father   . Cancer Father   . Lung cancer Brother   . Cancer Brother   . Bladder Cancer Neg Hx   . Prostate cancer Neg Hx   . Breast cancer Neg Hx     Social History:  reports that  has never smoked. she has never used smokeless tobacco. She reports that she does not drink alcohol or use drugs.  Patient has never smoked or used alcohol. She denies any  known exposures to radiations or toxins. 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 She lives in [place]. The patient is alone today.  Allergies:  Allergies  Allergen Reactions  . Ambien [Zolpidem] Itching and Rash  . Penicillin G Rash  . Penicillins Itching and Rash    Has patient had a PCN reaction causing immediate rash, facial/tongue/throat swelling, SOB or lightheadedness with hypotension: No Has patient had a PCN reaction causing severe rash involving mucus membranes or skin necrosis: No Has patient had a PCN reaction that required hospitalization: No Has patient had a PCN reaction occurring within the last 10 years: No If all of the above answers are "NO", then may proceed with Cephalosporin use.     Current Medications: Current Outpatient Medications  Medication Sig Dispense Refill  . acetaminophen (TYLENOL ARTHRITIS PAIN) 650 MG CR tablet Take 650 mg by mouth every 8 (eight) hours as needed for pain.    Marland Kitchen aspirin EC 81 MG tablet Take 81 mg by mouth daily.    Marland Kitchen atorvastatin (LIPITOR) 10 MG tablet Take 10 mg by mouth daily at 6 PM.    . Cholecalciferol (D 5000) 5000 units TABS Take 5,000 Units by mouth daily.    . FEROSUL 325 (65 Fe) MG tablet Take 325 mg by mouth daily.  1  . ibuprofen (ADVIL,MOTRIN) 200 MG tablet Take 200 mg by mouth every 6 (six) hours as needed for mild pain.    . nabumetone (RELAFEN) 500 MG tablet Take 500 mg by mouth 2 (two) times daily.  0  . tamoxifen (NOLVADEX) 20 MG tablet Take 1 tablet (20 mg total) daily by mouth. 30 tablet 0  . TOLTERODINE TARTRATE ER PO Take 4 mg by mouth every morning.      No current facility-administered medications for this visit.     Review of Systems:  GENERAL:  Feels "well".  No fevers or sweats. Weight down 1 pound.  PERFORMANCE STATUS (ECOG):  1 HEENT:  Hoarse voice when dry.  Vision change secondary to cataracts.  No runny nose, sore throat, mouth sores or tenderness. Lungs:  No shortness of breath or cough.  No hemoptysis. Cardiac:  No chest pain, palpitations, orthopnea, or PND. GI:  No nausea, vomiting, diarrhea, constipation, melena or hematochezia. GU:  No urgency, frequency, dysuria, or hematuria. Musculoskeletal: Chronic shoulder pain (upcoming MRI to evaluate rotator cuff).  No back pain. No muscle tenderness. Extremities:  No pain or swelling. Skin:  No rashes or skin changes. Neuro:  No headache, numbness or weakness, balance or coordination issues. Endocrine:  No diabetes, thyroid issues, hot flashes or night sweats. Psych:  No mood changes, depression or anxiety. Pain:  Shoulder pain (8 out of 10). Review of systems:  All other systems reviewed and found to be negative.  Physical Exam: Blood pressure (!) 147/72, pulse 67, temperature 98.8 F (37.1 C), temperature source Tympanic, resp. rate 20,  weight 130 lb 4.7 oz (59.1 kg). GENERAL:  Well developed, well nourished, woman sitting comfortably in the exam room in no acute distress. MENTAL STATUS:  Alert and oriented to person, place and time. HEAD:  Blonde styled hair.  Normocephalic, atraumatic, face symmetric, EYES:  Blue eyes.  Pupils equal round and reactive to light and accomodation.  No conjunctivitis or scleral icterus. ENT:  Oropharynx clear without lesion.  Tongue normal. Mucous membranes moist.  RESPIRATORY:  Clear to auscultation without rales, wheezes or rhonchi. CARDIOVASCULAR:  Regular rate and rhythm without murmur, rub or gallop. ABDOMEN:  Soft, non-tender, with active bowel sounds, and no hepatosplenomegaly.  No masses. SKIN:  No rashes, ulcers or lesions. EXTREMITIES: No edema, no skin discoloration or tenderness.  No palpable cords. LYMPH NODES: No palpable cervical, supraclavicular, axillary or inguinal adenopathy  NEUROLOGICAL: Unremarkable. PSYCH:  Appropriate   Appointment on 04/21/2017  Component Date Value Ref Range Status  . Ferritin 04/21/2017 84  11 - 307 ng/mL Final   . Sodium 04/21/2017 136  135 - 145 mmol/L Final  . Potassium 04/21/2017 4.1  3.5 - 5.1 mmol/L Final  . Chloride 04/21/2017 106  101 - 111 mmol/L Final  . CO2 04/21/2017 23  22 - 32 mmol/L Final  . Glucose, Bld 04/21/2017 168* 65 - 99 mg/dL Final  . BUN 04/21/2017 14  6 - 20 mg/dL Final  . Creatinine, Ser 04/21/2017 0.78  0.44 - 1.00 mg/dL Final  . Calcium 04/21/2017 8.3* 8.9 - 10.3 mg/dL Final  . Total Protein 04/21/2017 6.8  6.5 - 8.1 g/dL Final  . Albumin 04/21/2017 3.3* 3.5 - 5.0 g/dL Final  . AST 04/21/2017 18  15 - 41 U/L Final  . ALT 04/21/2017 10* 14 - 54 U/L Final  . Alkaline Phosphatase 04/21/2017 40  38 - 126 U/L Final  . Total Bilirubin 04/21/2017 0.1* 0.3 - 1.2 mg/dL Final  . GFR calc non Af Amer 04/21/2017 >60  >60 mL/min Final  . GFR calc Af Amer 04/21/2017 >60  >60 mL/min Final   Comment: (NOTE) The eGFR has been calculated using the CKD EPI equation. This calculation has not been validated in all clinical situations. eGFR's persistently <60 mL/min signify possible Chronic Kidney Disease.   . Anion gap 04/21/2017 7  5 - 15 Final  . WBC 04/21/2017 8.7  3.6 - 11.0 K/uL Final  . RBC 04/21/2017 4.13  3.80 - 5.20 MIL/uL Final  . Hemoglobin 04/21/2017 10.0* 12.0 - 16.0 g/dL Final  . HCT 04/21/2017 31.1* 35.0 - 47.0 % Final  . MCV 04/21/2017 75.4* 80.0 - 100.0 fL Final  . MCH 04/21/2017 24.3* 26.0 - 34.0 pg Final  . MCHC 04/21/2017 32.2  32.0 - 36.0 g/dL Final  . RDW 04/21/2017 20.4* 11.5 - 14.5 % Final  . Platelets 04/21/2017 450* 150 - 440 K/uL Final  . Neutrophils Relative % 04/21/2017 81  % Final  . Neutro Abs 04/21/2017 7.1* 1.4 - 6.5 K/uL Final  . Lymphocytes Relative 04/21/2017 12  % Final  . Lymphs Abs 04/21/2017 1.0  1.0 - 3.6 K/uL Final  . Monocytes Relative 04/21/2017 5  % Final  . Monocytes Absolute 04/21/2017 0.4  0.2 - 0.9 K/uL Final  . Eosinophils Relative 04/21/2017 1  % Final  . Eosinophils Absolute 04/21/2017 0.1  0 - 0.7 K/uL Final  . Basophils  Relative 04/21/2017 1  % Final  . Basophils Absolute 04/21/2017 0.1  0 - 0.1 K/uL Final    Assessment:  PHYLIS JAVED is a 71 y.o. female with atypical ductal hyperplasia (ADH) s/p excision of the 2 right breast lesions on 02/20/2017.  The upper right lesion revealed biopsy cavity with residual changes of radial scar. There was columnar cell change with calcification. There was sclerosing adenosis, negative for atypia and malignancy.  The right central breast lesion revealed a small focus of atypical ductal hyperplasia (ADH), margins were negative. There is biopsy cavity with residual changes of radial scar.   Estimated breast cancer risk by the [facility] model is 4.6%.  She began tamoxifen on 03/25/2017.  She is tolerating it well.  She has a recent history of a normocytic anemia and associated thrombocytosis.  CBC on 03/11/2017 revealed a hemoglobin of 8.5, hematocrit 27.3, MCV 80.3, and platelet count of 536,000.  Iron saturations was low at 4% with a TIBC of 262.  Ferritin was slightly low at 77.  TSH was normal on 03/11/2017.  Urinlaysis was negative for blood.  B12 level was borderline low at 385.  Normal labs on 03/18/2017 included the following: DAT, folate, and MMA.  Sed rate was elevated at 116 (0-30).  Reticulocyte count was 1.4% (inappropriately low).   Ferritin has been followed: 77 on 03/11/2017 and 84 on 04/21/2017.  She has an elevated sed rate.  Patient underwent a pancreaticoduodenectomy (Whipple procedure) on 09/19/2015. Pathology was negative for malignancy.  Colonoscopy was negative 4 years ago (due 2019).  One of 5 guaiac cards were positive from the [facility]. She denies an melena or hematochezia.  Diet is good.  She denies ice pica.  She is on oral iron 3 x a day.    Symptomatically, she is doing well. Patient has shoulder issues. Exam stable. Hemoglobin is 10.0 with hematocrit 31.1. Ferritin is normal at 84.  Plan: 1.  Review labs from 04/21/2017.  LFT are normal.  Anemia  improving on oral iron. 2.  Discuss iron deficiency anemia. Hemoglobin and hematocrit are stable.  MCV is 75.4 (low).  Discuss guaiac cards and plan for colonoscopy in the spring.  Continue oral iron supplementation with vitamin C daily. Intravenous iron replacement is not indicated at this time, as patient is tolerating oral iron and CBC is showing improvement. ESR elevated at 116, which could represent false elevation in patient's ferritin.   3.  Discuss ongoing close surveillance with exam every 6 months and annual mammogram.   4.  Continue tamoxifen.  Rx for tamoxifen '20mg'$  daily (Disp # 90 with 3 refills). 5.  Anticipate follow-up every 6 months after next visit. 6.  RTC in 3 months for MD assessment and labs (CBC with diff, CMP, ferritin).   Honor Loh, NP  04/22/2017, 9:32 AM   I saw and evaluated the patient, participating in the key portions of the service and reviewing pertinent diagnostic studies and records.  I reviewed the nurse practitioner's note and agree with the findings and the plan.  The assessment and plan were discussed with the patient.  Multiple questions were asked by the patient and answered.   Nolon Stalls, MD 04/22/2017, 9:32 AM

## 2017-04-22 ENCOUNTER — Inpatient Hospital Stay: Payer: Medicare Other

## 2017-04-22 ENCOUNTER — Other Ambulatory Visit: Payer: Self-pay | Admitting: Hematology and Oncology

## 2017-04-22 ENCOUNTER — Encounter: Payer: Self-pay | Admitting: Hematology and Oncology

## 2017-04-22 ENCOUNTER — Inpatient Hospital Stay (HOSPITAL_BASED_OUTPATIENT_CLINIC_OR_DEPARTMENT_OTHER): Payer: Medicare Other | Admitting: Hematology and Oncology

## 2017-04-22 VITALS — BP 147/72 | HR 67 | Temp 98.8°F | Resp 20 | Wt 130.3 lb

## 2017-04-22 DIAGNOSIS — N393 Stress incontinence (female) (male): Secondary | ICD-10-CM | POA: Diagnosis not present

## 2017-04-22 DIAGNOSIS — Z7982 Long term (current) use of aspirin: Secondary | ICD-10-CM

## 2017-04-22 DIAGNOSIS — R609 Edema, unspecified: Secondary | ICD-10-CM | POA: Diagnosis not present

## 2017-04-22 DIAGNOSIS — M5030 Other cervical disc degeneration, unspecified cervical region: Secondary | ICD-10-CM | POA: Diagnosis not present

## 2017-04-22 DIAGNOSIS — Z800 Family history of malignant neoplasm of digestive organs: Secondary | ICD-10-CM

## 2017-04-22 DIAGNOSIS — R700 Elevated erythrocyte sedimentation rate: Secondary | ICD-10-CM

## 2017-04-22 DIAGNOSIS — Z79810 Long term (current) use of selective estrogen receptor modulators (SERMs): Secondary | ICD-10-CM

## 2017-04-22 DIAGNOSIS — E785 Hyperlipidemia, unspecified: Secondary | ICD-10-CM

## 2017-04-22 DIAGNOSIS — M1990 Unspecified osteoarthritis, unspecified site: Secondary | ICD-10-CM

## 2017-04-22 DIAGNOSIS — G8929 Other chronic pain: Secondary | ICD-10-CM | POA: Diagnosis not present

## 2017-04-22 DIAGNOSIS — Z85828 Personal history of other malignant neoplasm of skin: Secondary | ICD-10-CM

## 2017-04-22 DIAGNOSIS — D509 Iron deficiency anemia, unspecified: Secondary | ICD-10-CM

## 2017-04-22 DIAGNOSIS — N6091 Unspecified benign mammary dysplasia of right breast: Secondary | ICD-10-CM

## 2017-04-22 DIAGNOSIS — M7918 Myalgia, other site: Secondary | ICD-10-CM

## 2017-04-22 DIAGNOSIS — Z801 Family history of malignant neoplasm of trachea, bronchus and lung: Secondary | ICD-10-CM

## 2017-04-22 DIAGNOSIS — R011 Cardiac murmur, unspecified: Secondary | ICD-10-CM

## 2017-04-22 DIAGNOSIS — Z79899 Other long term (current) drug therapy: Secondary | ICD-10-CM

## 2017-04-22 DIAGNOSIS — M8580 Other specified disorders of bone density and structure, unspecified site: Secondary | ICD-10-CM

## 2017-04-22 MED ORDER — TAMOXIFEN CITRATE 20 MG PO TABS: 20.0000 mg | ORAL_TABLET | Freq: Every day | ORAL | 3 refills | Status: AC

## 2017-04-22 NOTE — Progress Notes (Signed)
Patient is having an MRI next week for her rotator cuff.  Otherwise, no complaints today.

## 2017-04-29 ENCOUNTER — Ambulatory Visit
Admission: RE | Admit: 2017-04-29 | Discharge: 2017-04-29 | Disposition: A | Discharge status: Home / Self Care | Payer: Medicare Other | Source: Ambulatory Visit | Attending: Orthopedic Surgery | Admitting: Orthopedic Surgery

## 2017-04-29 DIAGNOSIS — M75102 Unspecified rotator cuff tear or rupture of left shoulder, not specified as traumatic: Secondary | ICD-10-CM | POA: Insufficient documentation

## 2017-04-29 DIAGNOSIS — M19012 Primary osteoarthritis, left shoulder: Secondary | ICD-10-CM | POA: Insufficient documentation

## 2017-04-29 DIAGNOSIS — M25511 Pain in right shoulder: Secondary | ICD-10-CM | POA: Diagnosis present

## 2017-04-29 DIAGNOSIS — M19011 Primary osteoarthritis, right shoulder: Secondary | ICD-10-CM | POA: Diagnosis not present

## 2017-04-29 DIAGNOSIS — M25512 Pain in left shoulder: Secondary | ICD-10-CM

## 2017-04-29 DIAGNOSIS — M7552 Bursitis of left shoulder: Secondary | ICD-10-CM | POA: Insufficient documentation

## 2017-04-29 DIAGNOSIS — M75101 Unspecified rotator cuff tear or rupture of right shoulder, not specified as traumatic: Secondary | ICD-10-CM | POA: Insufficient documentation

## 2017-05-14 ENCOUNTER — Encounter: Payer: Self-pay | Admitting: Anesthesiology

## 2017-05-20 ENCOUNTER — Encounter: Payer: Self-pay | Admitting: *Deleted

## 2017-05-20 ENCOUNTER — Other Ambulatory Visit: Payer: Self-pay

## 2017-05-20 NOTE — Discharge Instructions (Signed)
General Anesthesia, Adult, Care After °These instructions provide you with information about caring for yourself after your procedure. Your health care provider may also give you more specific instructions. Your treatment has been planned according to current medical practices, but problems sometimes occur. Call your health care provider if you have any problems or questions after your procedure. °What can I expect after the procedure? °After the procedure, it is common to have: °· Vomiting. °· A sore throat. °· Mental slowness. ° °It is common to feel: °· Nauseous. °· Cold or shivery. °· Sleepy. °· Tired. °· Sore or achy, even in parts of your body where you did not have surgery. ° °Follow these instructions at home: °For at least 24 hours after the procedure: °· Do not: °? Participate in activities where you could fall or become injured. °? Drive. °? Use heavy machinery. °? Drink alcohol. °? Take sleeping pills or medicines that cause drowsiness. °? Make important decisions or sign legal documents. °? Take care of children on your own. °· Rest. °Eating and drinking °· If you vomit, drink water, juice, or soup when you can drink without vomiting. °· Drink enough fluid to keep your urine clear or pale yellow. °· Make sure you have little or no nausea before eating solid foods. °· Follow the diet recommended by your health care provider. °General instructions °· Have a responsible adult stay with you until you are awake and alert. °· Return to your normal activities as told by your health care provider. Ask your health care provider what activities are safe for you. °· Take over-the-counter and prescription medicines only as told by your health care provider. °· If you smoke, do not smoke without supervision. °· Keep all follow-up visits as told by your health care provider. This is important. °Contact a health care provider if: °· You continue to have nausea or vomiting at home, and medicines are not helpful. °· You  cannot drink fluids or start eating again. °· You cannot urinate after 8-12 hours. °· You develop a skin rash. °· You have fever. °· You have increasing redness at the site of your procedure. °Get help right away if: °· You have difficulty breathing. °· You have chest pain. °· You have unexpected bleeding. °· You feel that you are having a life-threatening or urgent problem. °This information is not intended to replace advice given to you by your health care provider. Make sure you discuss any questions you have with your health care provider. °Document Released: 08/11/2000 Document Revised: 10/08/2015 Document Reviewed: 04/19/2015 °Elsevier Interactive Patient Education © 2018 [employer] ° °

## 2017-05-27 ENCOUNTER — Encounter: Payer: Self-pay | Admitting: *Deleted

## 2017-05-27 ENCOUNTER — Telehealth: Payer: Self-pay | Admitting: Urgent Care

## 2017-05-27 ENCOUNTER — Other Ambulatory Visit: Payer: Self-pay | Admitting: Urgent Care

## 2017-05-27 ENCOUNTER — Inpatient Hospital Stay
Admission: EM | Admit: 2017-05-27 | Discharge: 2017-05-29 | DRG: 812 | Disposition: A | Discharge status: Home / Self Care | Payer: Medicare Other | Attending: Internal Medicine | Admitting: Internal Medicine

## 2017-05-27 ENCOUNTER — Other Ambulatory Visit: Payer: Self-pay

## 2017-05-27 ENCOUNTER — Other Ambulatory Visit
Admission: RE | Admit: 2017-05-27 | Discharge: 2017-05-27 | Disposition: A | Discharge status: Home / Self Care | Payer: Medicare Other | Source: Ambulatory Visit | Attending: Anesthesiology | Admitting: Anesthesiology

## 2017-05-27 DIAGNOSIS — M5030 Other cervical disc degeneration, unspecified cervical region: Secondary | ICD-10-CM | POA: Diagnosis present

## 2017-05-27 DIAGNOSIS — Z888 Allergy status to other drugs, medicaments and biological substances status: Secondary | ICD-10-CM

## 2017-05-27 DIAGNOSIS — K8681 Exocrine pancreatic insufficiency: Secondary | ICD-10-CM | POA: Diagnosis present

## 2017-05-27 DIAGNOSIS — M8580 Other specified disorders of bone density and structure, unspecified site: Secondary | ICD-10-CM | POA: Diagnosis present

## 2017-05-27 DIAGNOSIS — E785 Hyperlipidemia, unspecified: Secondary | ICD-10-CM | POA: Diagnosis present

## 2017-05-27 DIAGNOSIS — Z90411 Acquired partial absence of pancreas: Secondary | ICD-10-CM

## 2017-05-27 DIAGNOSIS — Z79899 Other long term (current) drug therapy: Secondary | ICD-10-CM

## 2017-05-27 DIAGNOSIS — M1990 Unspecified osteoarthritis, unspecified site: Secondary | ICD-10-CM | POA: Diagnosis present

## 2017-05-27 DIAGNOSIS — C50919 Malignant neoplasm of unspecified site of unspecified female breast: Secondary | ICD-10-CM | POA: Diagnosis present

## 2017-05-27 DIAGNOSIS — Z880 Allergy status to penicillin: Secondary | ICD-10-CM | POA: Diagnosis not present

## 2017-05-27 DIAGNOSIS — Z79810 Long term (current) use of selective estrogen receptor modulators (SERMs): Secondary | ICD-10-CM

## 2017-05-27 DIAGNOSIS — D509 Iron deficiency anemia, unspecified: Secondary | ICD-10-CM | POA: Diagnosis present

## 2017-05-27 DIAGNOSIS — D649 Anemia, unspecified: Secondary | ICD-10-CM | POA: Diagnosis present

## 2017-05-27 DIAGNOSIS — N6091 Unspecified benign mammary dysplasia of right breast: Secondary | ICD-10-CM | POA: Diagnosis present

## 2017-05-27 DIAGNOSIS — K869 Disease of pancreas, unspecified: Secondary | ICD-10-CM | POA: Diagnosis present

## 2017-05-27 DIAGNOSIS — Z7982 Long term (current) use of aspirin: Secondary | ICD-10-CM

## 2017-05-27 HISTORY — DX: Anemia, unspecified: D64.9

## 2017-05-27 LAB — CBC
HCT: 19.5 % — ABNORMAL LOW (ref 35.0–47.0)
HEMATOCRIT: 18.8 % — AB (ref 35.0–47.0)
HEMOGLOBIN: 6.2 g/dL — AB (ref 12.0–16.0)
Hemoglobin: 6 g/dL — ABNORMAL LOW (ref 12.0–16.0)
MCH: 25.7 pg — AB (ref 26.0–34.0)
MCH: 25.9 pg — AB (ref 26.0–34.0)
MCHC: 31.8 g/dL — AB (ref 32.0–36.0)
MCHC: 31.7 g/dL — AB (ref 32.0–36.0)
MCV: 80.7 fL (ref 80.0–100.0)
MCV: 81.6 fL (ref 80.0–100.0)
PLATELETS: 423 10*3/uL (ref 150–440)
Platelets: 420 10*3/uL (ref 150–440)
RBC: 2.33 MIL/uL — AB (ref 3.80–5.20)
RBC: 2.39 MIL/uL — ABNORMAL LOW (ref 3.80–5.20)
RDW: 21.7 % — ABNORMAL HIGH (ref 11.5–14.5)
RDW: 22.3 % — AB (ref 11.5–14.5)
WBC: 6.6 10*3/uL (ref 3.6–11.0)
WBC: 7.9 10*3/uL (ref 3.6–11.0)

## 2017-05-27 LAB — IRON AND TIBC
Iron: 13 ug/dL — ABNORMAL LOW (ref 28–170)
SATURATION RATIOS: 4 % — AB (ref 10.4–31.8)
TIBC: 311 ug/dL (ref 250–450)
UIBC: 298 ug/dL

## 2017-05-27 LAB — COMPREHENSIVE METABOLIC PANEL
ALBUMIN: 3.4 g/dL — AB (ref 3.5–5.0)
ALT: 9 U/L — AB (ref 14–54)
ANION GAP: 9 (ref 5–15)
AST: 17 U/L (ref 15–41)
Alkaline Phosphatase: 30 U/L — ABNORMAL LOW (ref 38–126)
BILIRUBIN TOTAL: 0.1 mg/dL — AB (ref 0.3–1.2)
BUN: 16 mg/dL (ref 6–20)
CALCIUM: 8.2 mg/dL — AB (ref 8.9–10.3)
CO2: 22 mmol/L (ref 22–32)
CREATININE: 0.85 mg/dL (ref 0.44–1.00)
Chloride: 108 mmol/L (ref 101–111)
GFR calc Af Amer: >60 mL/min (ref >60)
GFR calc non Af Amer: >60 mL/min (ref >60)
GLUCOSE: 116 mg/dL — AB (ref 65–99)
Potassium: 4.1 mmol/L (ref 3.5–5.1)
Sodium: 139 mmol/L (ref 135–145)
TOTAL PROTEIN: 6.8 g/dL (ref 6.5–8.1)

## 2017-05-27 LAB — LACTATE DEHYDROGENASE: LDH: 136 U/L (ref 98–192)

## 2017-05-27 LAB — ABO/RH: ABO/RH(D): A POS

## 2017-05-27 LAB — PREPARE RBC (CROSSMATCH)

## 2017-05-27 MED ORDER — VITAMIN D 1000 UNITS PO TABS
5000.0000 [IU] | ORAL_TABLET | Freq: Every day | ORAL | Status: DC
  Administered 2017-05-28 – 2017-05-29 (×2): 5000 [IU] via ORAL
  Filled 2017-05-27 (×2): qty 5

## 2017-05-27 MED ORDER — DOCUSATE SODIUM 100 MG PO CAPS
100.0000 mg | ORAL_CAPSULE | Freq: Two times a day (BID) | ORAL | Status: DC | PRN
Start: 2017-05-27 — End: 2017-05-29

## 2017-05-27 MED ORDER — SODIUM CHLORIDE 0.9 % IV SOLN
Freq: Once | INTRAVENOUS | Status: AC
  Administered 2017-05-27: 23:00:00 via INTRAVENOUS

## 2017-05-27 MED ORDER — IBUPROFEN 400 MG PO TABS: 200.0000 mg | ORAL_TABLET | Freq: Four times a day (QID) | ORAL | Status: DC | PRN

## 2017-05-27 MED ORDER — FERROUS SULFATE 325 (65 FE) MG PO TABS
325.0000 mg | ORAL_TABLET | Freq: Three times a day (TID) | ORAL | Status: DC
  Administered 2017-05-28 – 2017-05-29 (×4): 325 mg via ORAL
  Filled 2017-05-27 (×4): qty 1

## 2017-05-27 MED ORDER — ASPIRIN EC 81 MG PO TBEC
81.0000 mg | DELAYED_RELEASE_TABLET | Freq: Every day | ORAL | Status: DC
Start: 2017-05-27 — End: 2017-05-29
  Administered 2017-05-28 – 2017-05-29 (×2): 81 mg via ORAL
  Filled 2017-05-27 (×2): qty 1

## 2017-05-27 MED ORDER — VITAMIN B-12 1000 MCG PO TABS
3000.0000 ug | ORAL_TABLET | Freq: Every day | ORAL | Status: DC
  Administered 2017-05-28 – 2017-05-29 (×2): 3000 ug via ORAL
  Filled 2017-05-27 (×2): qty 3

## 2017-05-27 MED ORDER — FESOTERODINE FUMARATE ER 4 MG PO TB24
8.0000 mg | ORAL_TABLET | ORAL | Status: DC
  Administered 2017-05-28 – 2017-05-29 (×2): 8 mg via ORAL
  Filled 2017-05-27: qty 1
  Filled 2017-05-27: qty 2

## 2017-05-27 MED ORDER — TAMOXIFEN CITRATE 10 MG PO TABS
20.0000 mg | ORAL_TABLET | Freq: Every day | ORAL | Status: DC
  Administered 2017-05-28 – 2017-05-29 (×2): 20 mg via ORAL
  Filled 2017-05-27 (×2): qty 2

## 2017-05-27 MED ORDER — ACETAMINOPHEN 325 MG PO TABS
650.0000 mg | ORAL_TABLET | Freq: Three times a day (TID) | ORAL | Status: DC | PRN
  Administered 2017-05-29: 01:00:00 650 mg via ORAL
  Filled 2017-05-27: qty 2

## 2017-05-27 MED ORDER — ATORVASTATIN CALCIUM 20 MG PO TABS
10.0000 mg | ORAL_TABLET | Freq: Every day | ORAL | Status: DC
  Administered 2017-05-27 – 2017-05-28 (×2): 10 mg via ORAL
  Filled 2017-05-27 (×2): qty 1

## 2017-05-27 NOTE — ED Provider Notes (Addendum)
[facility] Emergency Department Provider Note  ____________________________________________   First MD Initiated Contact with Patient 05/27/17 1657     (approximate)  I have reviewed the triage vital signs and the nursing notes.   HISTORY  Chief Complaint Abnormal Lab   HPI Tabitha Rice is a 72 y.o. female with a history of iron deficiency anemia who is presenting to the emergency waiting a preoperative screening of her hemoglobin that came back at 6.  She says that she has had some mild fatigue over the past week but was doing chores around the house this morning without any issue.  She says that she has had a decrease in her dietary intake over the past couple months because of pain in her bilateral shoulders.  The surgery that she was having preoperative labs for was a rotator cuff surgery.  She says that she is also been off of her vitamins, iron and aspirin over the past week.  Denies any blood in her stool.   Past Medical History:  Diagnosis Date  . Allergic rhinitis 02/12/2015  . Arthritis    osteoarthritis  . Arthritis, degenerative 02/12/2015  . Basal cell carcinoma of cheek 10/25/2012   Overview:  Basal cell carcinoma of left medial cheek treated with Mohs surgery on 10/25/2012.   Marland Kitchen Basal cell carcinoma of face 01/28/2012   Overview:  BCC of left medial cheek treated with Mohs surgery 01-28-12   . CA skin, basal cell 02/12/2015  . Climacteric 02/12/2015  . DDD (degenerative disc disease), cervical   . Edema, peripheral 02/12/2015  . Female genuine stress incontinence 02/12/2015  . Heart murmur   . HLD (hyperlipidemia) 02/12/2015  . Hyperlipidemia   . Injury of eyeball 02/12/2015   Overview:  LEFT RETINAL TRAUMA   . Inverted nipple    left nipple inverted, nothing new  . Osteopenia 02/12/2015  . Skin cancer, basal cell   . Stasis, venous 02/12/2015  . Wears contact lenses     Patient Active Problem List   Diagnosis Date Noted  . Iron deficiency  anemia 03/25/2017  . 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 Anemia 03/18/2017  . Abdominal pain 02/28/2015  . Allergic rhinitis 02/12/2015  . DDD (degenerative disc disease), cervical 02/12/2015  . Allergy to environmental factors 02/12/2015  . HLD (hyperlipidemia) 02/12/2015  . Climacteric 02/12/2015  . Arthritis, degenerative 02/12/2015  . Osteopenia 02/12/2015  . Edema, peripheral 02/12/2015  . Sinus infection 02/12/2015  . CA skin, basal cell 02/12/2015  . Injury of eyeball 02/12/2015  . Female genuine stress incontinence 02/12/2015  . Stasis, venous 02/12/2015  . Basal cell carcinoma of cheek 10/25/2012  . Basal cell carcinoma of face 01/28/2012    Past Surgical History:  Procedure Laterality Date  . ABDOMINAL HYSTERECTOMY  1982  . APPENDECTOMY    . BREAST BIOPSY Left 11/23/2014   Procedure: BREAST BIOPSY WITH NEEDLE LOCALIZATION;  Surgeon: Leonie Green, MD;  Location: [facility] ORS;  Service: General;  Laterality: Left;  . BREAST BIOPSY Left 09/25/2014   complex sclerosing lesion  . BREAST BIOPSY Right 01/13/2017   2 area/path pending  . BREAST EXCISIONAL BIOPSY Right 02/20/2017   path pending  . BREAST LUMPECTOMY Left 11/23/2014   complex sclerosing lesion excised.  Marland Kitchen BREAST LUMPECTOMY WITH NEEDLE LOCALIZATION Right 02/20/2017   Procedure: EXCISION OF RIGHT BREAST MASS WITH NEEDLE LOCALIZATION;  Surgeon: Leonie Green, MD;  Location: [facility] ORS;  Service: General;  Laterality: Right;  .  CHOLECYSTECTOMY    . DILATION AND CURETTAGE OF UTERUS    . Partial hystrectomy  1982  . 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 Authorizing Provider  acetaminophen (TYLENOL ARTHRITIS PAIN) 650 MG CR tablet Take 650 mg by mouth every 8 (eight) hours as needed for pain.    [provider]  aspirin EC 81 MG tablet Take 81  mg by mouth daily.    [provider]  atorvastatin (LIPITOR) 10 MG tablet Take 10 mg by mouth daily at 6 PM.    [provider]  Cholecalciferol (D 5000) 5000 units TABS Take 5,000 Units by mouth daily.    [provider]  FEROSUL 325 (65 Fe) MG tablet Take 325 mg by mouth daily. 03/11/17   [provider]  ibuprofen (ADVIL,MOTRIN) 200 MG tablet Take 200 mg by mouth every 6 (six) hours as needed for mild pain.    [provider]  nabumetone (RELAFEN) 500 MG tablet Take 500 mg by mouth 2 (two) times daily. 02/24/17   [provider]  tamoxifen (NOLVADEX) 20 MG tablet Take 1 tablet (20 mg total) by mouth daily. 04/22/17   Karen Kitchens, NP  TOLTERODINE TARTRATE ER PO Take 4 mg by mouth every morning.     [provider]    Allergies Ambien [zolpidem]; Penicillin g; and Penicillins  Family History  Problem Relation Age of Onset  . Lung cancer Mother   . Cancer Mother   . Stomach cancer Father   . CVA Father   . Cancer Father   . Lung cancer Brother   . Cancer Brother   . Bladder Cancer Neg Hx   . Prostate cancer Neg Hx   . Breast cancer Neg Hx     Social History Social History   Tobacco Use  . Smoking status: Never Smoker  . Smokeless tobacco: Never Used  Substance Use Topics  . Alcohol use: No  . Drug use: No    Review of Systems  Constitutional: No fever/chills Eyes: No visual changes. ENT: No sore throat. Cardiovascular: Denies chest pain. Respiratory: Denies shortness of breath. Gastrointestinal: No abdominal pain.  No nausea, no vomiting.  No diarrhea.  No constipation. Genitourinary: Negative for dysuria. Musculoskeletal: Negative for back pain. Skin: Negative for rash. Neurological: Negative for headaches, focal weakness or numbness.   ____________________________________________   PHYSICAL EXAM:  VITAL SIGNS: ED Triage Vitals  Enc Vitals Group     BP 05/27/17 1617 (!) 145/68     Pulse Rate  05/27/17 1617 90     Resp 05/27/17 1617 18     Temp 05/27/17 1617 99.2 F (37.3 C)     Temp Source 05/27/17 1617 Oral     SpO2 05/27/17 1617 100 %     Weight 05/27/17 1619 132 lb (59.9 kg)     Height 05/27/17 1619 5\' 3"  (1.6 m)     Head Circumference --      Peak Flow --      Pain Score --      Pain Loc --      Pain Edu? --      Excl. in [place]? --     Constitutional: Alert and oriented. Well appearing and in no acute distress. Eyes: Conjunctivae are pale Head: Atraumatic. Nose: No congestion/rhinnorhea. Mouth/Throat: Mucous membranes are  moist.  Neck: No stridor.   Cardiovascular: Normal rate, regular rhythm. Grossly normal heart sounds.   Respiratory: Normal respiratory effort.  No retractions. Lungs CTAB. Gastrointestinal: Soft and nontender. No distention.  External exam with several non-engorged hemorrhoids.  Digital rectal exam with a small amount of brown stool which is heme-negative on Hemoccult developer card. Musculoskeletal: No lower extremity tenderness nor edema.  No joint effusions. Neurologic:  Normal speech and language. No gross focal neurologic deficits are appreciated. Skin:  Skin is warm, dry and intact. No rash noted. Psychiatric: Mood and affect are normal. Speech and behavior are normal.  ____________________________________________   LABS (all labs ordered are listed, but only abnormal results are displayed)  Labs Reviewed  COMPREHENSIVE METABOLIC PANEL - Abnormal; Notable for the following components:      Result Value   Glucose, Bld 116 (*)    Calcium 8.2 (*)    Albumin 3.4 (*)    ALT 9 (*)    Alkaline Phosphatase 30 (*)    Total Bilirubin 0.1 (*)    All other components within normal limits  CBC - Abnormal; Notable for the following components:   RBC 2.39 (*)    Hemoglobin 6.2 (*)    HCT 19.5 (*)    MCH 25.9 (*)    MCHC 31.7 (*)    RDW 22.3 (*)    All other components within normal limits  TYPE AND SCREEN    ____________________________________________  EKG   ____________________________________________  RADIOLOGY   ____________________________________________   PROCEDURES  Procedure(s) performed:   Procedures  Critical Care performed:  CRITICAL CARE Performed by: Doran Stabler   Total critical care time: 35 minutes  Critical care time was exclusive of separately billable procedures and treating other patients.  Critical care was necessary to treat or prevent imminent or life-threatening deterioration.  Critical care was time spent personally by me on the following activities: development of treatment plan with patient and/or surrogate as well as nursing, discussions with consultants, evaluation of patient's response to treatment, examination of patient, obtaining history from patient or surrogate, ordering and performing treatments and interventions, ordering and review of laboratory studies, ordering and review of radiographic studies, pulse oximetry and re-evaluation of patient's condition.  ____________________________________________   INITIAL IMPRESSION / ASSESSMENT AND PLAN / ED COURSE  Pertinent labs & imaging results that were available during my care of the patient were reviewed by me and considered in my medical decision making (see chart for details).  DDX: Anemia, GI bleeding, iron deficiency anemia, weakness, renal failure, dehydration, malnutrition As part of my medical decision making, I reviewed the following data within the [facility] chart reviewed, Notes from prior ED visits and [place] Controlled Substance Database  Patient also sees Dr. Mike Gip of the hematology oncology service.  Patient to be admitted.  She as well as her family are updated about the diagnosis as well as the treatment plan and they are understanding and willing to comply.  Will order a unit of blood in the emergency department.       ____________________________________________   FINAL CLINICAL IMPRESSION(S) / ED DIAGNOSES  Anemia    NEW MEDICATIONS STARTED DURING THIS VISIT:  This SmartLink is deprecated. Use AVSMEDLIST instead to display the medication list for a patient.   Note:  This document was prepared using Dragon voice recognition software and may include unintentional dictation errors.     Orbie Pyo, MD 05/27/17 [place], Randall An,  MD 06/11/17 1300

## 2017-05-27 NOTE — H&P (Signed)
[facility] [place] NAME: Tabitha Rice    MR#:  [id number]  DATE OF BIRTH:  03-25-1946  DATE OF ADMISSION:  05/27/2017  PRIMARY CARE PHYSICIAN: Idelle Crouch, MD   REQUESTING/REFERRING PHYSICIAN: [place]  CHIEF COMPLAINT:   Chief Complaint  Patient presents with  . Abnormal Lab    HISTORY OF PRESENT ILLNESS: Tabitha Rice  is a 72 y.o. female with a known history of Iron defi anemia, breast cancer, basal cell ca, Hyperlipidemia- follows at cancer center on oral iron- hb last month was 10. Also on tamoxifen for breast ca. Had planned rotator cuff surgery and as pre op- blood work showed hb 6, so sent to ER. She have c/o fatigue. Denies any blood loss. Guiac negative in ER.  PAST MEDICAL HISTORY:   Past Medical History:  Diagnosis Date  . Allergic rhinitis 02/12/2015  . Anemia   . Arthritis    osteoarthritis  . Arthritis, degenerative 02/12/2015  . Basal cell carcinoma of cheek 10/25/2012   Overview:  Basal cell carcinoma of left medial cheek treated with Mohs surgery on 10/25/2012.   Marland Kitchen Basal cell carcinoma of face 01/28/2012   Overview:  BCC of left medial cheek treated with Mohs surgery 01-28-12   . CA skin, basal cell 02/12/2015  . Climacteric 02/12/2015  . DDD (degenerative disc disease), cervical   . Edema, peripheral 02/12/2015  . Female genuine stress incontinence 02/12/2015  . Heart murmur   . HLD (hyperlipidemia) 02/12/2015  . Hyperlipidemia   . Injury of eyeball 02/12/2015   Overview:  LEFT RETINAL TRAUMA   . Inverted nipple    left nipple inverted, nothing new  . Osteopenia 02/12/2015  . Skin cancer, basal cell   . Stasis, venous 02/12/2015  . Wears contact lenses     PAST SURGICAL HISTORY:  Past Surgical History:  Procedure Laterality Date  . ABDOMINAL HYSTERECTOMY  1982  . APPENDECTOMY    . BREAST BIOPSY Left 11/23/2014   Procedure: BREAST BIOPSY WITH NEEDLE LOCALIZATION;  Surgeon: Leonie Green, MD;  Location: [facility]  ORS;  Service: General;  Laterality: Left;  . BREAST BIOPSY Left 09/25/2014   complex sclerosing lesion  . BREAST BIOPSY Right 01/13/2017   2 area/path pending  . BREAST EXCISIONAL BIOPSY Right 02/20/2017   path pending  . BREAST LUMPECTOMY Left 11/23/2014   complex sclerosing lesion excised.  Marland Kitchen BREAST LUMPECTOMY WITH NEEDLE LOCALIZATION Right 02/20/2017   Procedure: EXCISION OF RIGHT BREAST MASS WITH NEEDLE LOCALIZATION;  Surgeon: Leonie Green, MD;  Location: [facility] ORS;  Service: General;  Laterality: Right;  . CHOLECYSTECTOMY    . DILATION AND CURETTAGE OF UTERUS    . Partial hystrectomy  1982  . WHIPPLE PROCEDURE  2017   thought pt had a slow growing pancreatic mass but it came back benign    SOCIAL HISTORY:  Social History   Tobacco Use  . Smoking status: Never Smoker  . Smokeless tobacco: Never Used  Substance Use Topics  . Alcohol use: No    FAMILY HISTORY:  Family History  Problem Relation Age of Onset  . Lung cancer Mother   . Cancer Mother   . Stomach cancer Father   . CVA Father   . Cancer Father   . Lung cancer Brother   . Cancer Brother   . Bladder Cancer Neg Hx   . Prostate cancer Neg Hx   . Breast cancer Neg Hx     DRUG  ALLERGIES:  Allergies  Allergen Reactions  . Ambien [Zolpidem] Itching and Rash  . Penicillin G Rash  . Penicillins Itching and Rash    Has patient had a PCN reaction causing immediate rash, facial/tongue/throat swelling, SOB or lightheadedness with hypotension: No Has patient had a PCN reaction causing severe rash involving mucus membranes or skin necrosis: No Has patient had a PCN reaction that required hospitalization: No Has patient had a PCN reaction occurring within the last 10 years: No If all of the above answers are "NO", then may proceed with Cephalosporin use.     REVIEW OF SYSTEMS:   CONSTITUTIONAL: No fever,positive for fatigue or weakness.  EYES: No blurred or double vision.  EARS, NOSE, AND THROAT: No  tinnitus or ear pain.  RESPIRATORY: No cough, shortness of breath, wheezing or hemoptysis.  CARDIOVASCULAR: No chest pain, orthopnea, edema.  GASTROINTESTINAL: No nausea, vomiting, diarrhea or abdominal pain.  GENITOURINARY: No dysuria, hematuria.  ENDOCRINE: No polyuria, nocturia,  HEMATOLOGY: No anemia, easy bruising or bleeding SKIN: No rash or lesion. MUSCULOSKELETAL: No joint pain or arthritis.   NEUROLOGIC: No tingling, numbness, weakness.  PSYCHIATRY: No anxiety or depression.   MEDICATIONS AT HOME:  Prior to Admission medications   Medication Sig Start Date End Date Taking? Authorizing Provider  acetaminophen (TYLENOL ARTHRITIS PAIN) 650 MG CR tablet Take 650 mg by mouth every 8 (eight) hours as needed for pain.   Yes [provider]  atorvastatin (LIPITOR) 10 MG tablet Take 10 mg by mouth daily at 6 PM.   Yes [provider]  Cholecalciferol (VITAMIN D3) 5000 units CAPS Take 5,000 Units by mouth daily.   Yes [provider]  Cyanocobalamin (VITAMIN B12) 3000 MCG SUBL Place 3,000 Units under the tongue daily.   Yes [provider]  ibuprofen (ADVIL,MOTRIN) 200 MG tablet Take 200 mg by mouth every 6 (six) hours as needed for mild pain.   Yes [provider]  tamoxifen (NOLVADEX) 20 MG tablet Take 1 tablet (20 mg total) by mouth daily. 04/22/17  Yes Karen Kitchens, NP  TOLTERODINE TARTRATE ER PO Take 4 mg by mouth every morning.    Yes [provider]  aspirin EC 81 MG tablet Take 81 mg by mouth daily.    [provider]  FEROSUL 325 (65 Fe) MG tablet Take 325 mg by mouth 3 (three) times daily.  03/11/17   [provider]      PHYSICAL EXAMINATION:   VITAL SIGNS: Blood pressure (!) 145/68, pulse 90, temperature 99.2 F (37.3 C), temperature source Oral, resp. rate 18, height 5\' 3"  (1.6 m), weight 59.9 kg (132 lb), SpO2 100 %.  GENERAL:  72 y.o.-year-old patient lying in the bed with no acute distress.  EYES:  Pupils equal, round, reactive to light and accommodation. No scleral icterus. Extraocular muscles intact.  HEENT: Head atraumatic, normocephalic. Oropharynx and nasopharynx clear.  NECK:  Supple, no jugular venous distention. No thyroid enlargement, no tenderness.  LUNGS: Normal breath sounds bilaterally, no wheezing, rales,rhonchi or crepitation. No use of accessory muscles of respiration.  CARDIOVASCULAR: S1, S2 normal. No murmurs, rubs, or gallops.  ABDOMEN: Soft, nontender, nondistended. Bowel sounds present. No organomegaly or mass.  EXTREMITIES: No pedal edema, cyanosis, or clubbing.  NEUROLOGIC: Cranial nerves II through XII are intact. Muscle strength 5/5 in all extremities. Sensation intact. Gait not checked.  PSYCHIATRIC: The patient is alert and oriented x 3.  SKIN: No obvious rash, lesion, or ulcer.   LABORATORY PANEL:  CBC Recent Labs  Lab 05/27/17 1039 05/27/17 1619  WBC 6.6 7.9  HGB 6.0* 6.2*  HCT 18.8* 19.5*  PLT 420 423  MCV 80.7 81.6  MCH 25.7* 25.9*  MCHC 31.8* 31.7*  RDW 21.7* 22.3*   ------------------------------------------------------------------------------------------------------------------  Chemistries  Recent Labs  Lab 05/27/17 1619  NA 139  K 4.1  CL 108  CO2 22  GLUCOSE 116*  BUN 16  CREATININE 0.85  CALCIUM 8.2*  AST 17  ALT 9*  ALKPHOS 30*  BILITOT 0.1*   ------------------------------------------------------------------------------------------------------------------ estimated creatinine clearance is 48 mL/min (by C-G formula based on SCr of 0.85 mg/dL). ------------------------------------------------------------------------------------------------------------------ No results for input(s): TSH, T4TOTAL, T3FREE, THYROIDAB in the last 72 hours.  Invalid input(s): FREET3   Coagulation profile No results for input(s): INR, PROTIME in the last 168  hours. ------------------------------------------------------------------------------------------------------------------- No results for input(s): DDIMER in the last 72 hours. -------------------------------------------------------------------------------------------------------------------  Cardiac Enzymes No results for input(s): CKMB, TROPONINI, MYOGLOBIN in the last 168 hours.  Invalid input(s): CK ------------------------------------------------------------------------------------------------------------------ Invalid input(s): POCBNP  ---------------------------------------------------------------------------------------------------------------  Urinalysis    Component Value Date/Time   APPEARANCEUR Clear 02/28/2015 0856   GLUCOSEU Negative 02/28/2015 0856   BILIRUBINUR Negative 02/28/2015 0856   PROTEINUR Negative 02/28/2015 0856   NITRITE Negative 02/28/2015 0856   LEUKOCYTESUR Trace (A) 02/28/2015 0856     RADIOLOGY: No results found.  EKG: Orders placed or performed during the hospital encounter of 02/16/17  . EKG 12-Lead  . EKG 12-Lead    IMPRESSION AND PLAN:  * Symptomatic anemia   Mostly iron deficiency   Transfuse one unit PRBC   Check for LDH and retic count   Iron studies   Hematology consult.  * Hyperlipidemia   Cont atorvastatin  * Breast cancer   Cont tamoxifen.  All the records are reviewed and case discussed with ED provider. Management plans discussed with the patient, family and they are in agreement.  CODE STATUS: Full. Code Status History    This patient does not have a recorded code status. Please follow your organizational policy for patients in this situation.     Husband and daughter in room.  TOTAL TIME TAKING CARE OF THIS PATIENT: 50 minutes.    Vaughan Basta M.D on 05/27/2017   Between 7am to 6pm - Pager - [phone]  After 6pm go to [email or web address] - password [email or web address] [facility]  Office   [phone]  CC: Primary care physician; Idelle Crouch, MD   Note: This dictation was prepared with Dragon dictation along with smaller phrase technology. Any transcriptional errors that result from this process are unintentional.

## 2017-05-27 NOTE — ED Triage Notes (Addendum)
Pt was scheduled for surgery tomorrow.  Pt had preop done and was called and told to come to erfor eval of low hgb.  Pt alert.  Pt states she feels weak.  No vomting, no blood seen in stools.

## 2017-05-27 NOTE — ED Notes (Signed)
Pt and family updated. No questions reported at this time. Pt reports the admitting MD was at bedside and answered all questions. Pt sitting on edge of bed after eating, in NAD. Family remains in room with pt.

## 2017-05-27 NOTE — Progress Notes (Signed)
I ordered a CBC due to pts history of anemia.  Hb was 6 down from 10 in December 2018.  I called the pt and she said she stopped her iron about a week ago.  She has been feeling tired but otherwise asymptomatic.  I spoke with Dr. Posey Pronto and her surgery for tomorrow is being cancelled due to anemia.  I spoke with the NP at Dr. Kem Parkinson office and they will be contacting the pt to arrange followup and any necessary treatment.  I called the pt and informed her of the above.  She understands the plan.

## 2017-05-27 NOTE — Progress Notes (Signed)
Family Meeting Note  Advance Directive:yes  Today a meeting took place with the Patient, spouse and daughter.   The following clinical team members were present during this meeting:MD  The following were discussed:Patient's diagnosis:breast cancer, anemia , Patient's progosis: Unable to determine and Goals for treatment: Full Code  Additional follow-up to be provided: hematology  Time spent during discussion:20 minutes  Vaughan Basta, MD

## 2017-05-27 NOTE — Telephone Encounter (Signed)
Call placed to patient to discuss lab results following a call from the surgery center. Patient was scheduled for rotator cuff repair tomorrow. Pre-operative labs today revealed that patient's hemoglobin had dropped to 6.0.   CBC on 04/21/2017 demonstrated a microcytic hypochromic anemia consistent with patient's known IDA diagnosis. Hemoglobin 10.0, hematocrit 31.1, MCV 75.4, MCH 24.3, and platelets 450,000.  Patient denies obviously gastrointestinal bleeding; no melena or hematochezia. The only thing that patient notes that she has done differently is that she stopped her oral iron supplement about a week ago. She was taking Ferosul 325 mg PO TID with vitamin C.   Patient symptomatic. She notes weakness and positional vertiginous changes. Patient was last seen here in the cancer center in December. Seeing whereas I did not see or evaluate this patient today, she was advised to go to the ED for blood and possible iron infusion. She was prepared for the fact that she will likely require admission for further investigation of her low hemoglobin. She is due follow-up colonoscopy in the Spring of this year. She is followed gastroenterology at [facility] status post her pancreaticoduodenectomy (Whipple). Call placed to ED charge nurse; spoke with Rush Landmark, RN to make him aware of the fact that patient was being brought into the ED via POV by her family.

## 2017-05-28 ENCOUNTER — Ambulatory Visit: Admission: RE | Admit: 2017-05-28 | Payer: Medicare Other | Source: Ambulatory Visit | Admitting: Orthopedic Surgery

## 2017-05-28 DIAGNOSIS — D509 Iron deficiency anemia, unspecified: Principal | ICD-10-CM

## 2017-05-28 HISTORY — DX: Presence of spectacles and contact lenses: Z97.3

## 2017-05-28 LAB — CBC
HEMATOCRIT: 22.7 % — AB (ref 35.0–47.0)
HEMOGLOBIN: 7.5 g/dL — AB (ref 12.0–16.0)
MCH: 26.8 pg (ref 26.0–34.0)
MCHC: 32.9 g/dL (ref 32.0–36.0)
MCV: 81.4 fL (ref 80.0–100.0)
Platelets: 390 10*3/uL (ref 150–440)
RBC: 2.79 MIL/uL — AB (ref 3.80–5.20)
RDW: 20 % — ABNORMAL HIGH (ref 11.5–14.5)
WBC: 9 10*3/uL (ref 3.6–11.0)

## 2017-05-28 LAB — RETICULOCYTES
RBC.: 2.79 MIL/uL — ABNORMAL LOW (ref 3.80–5.20)
RETIC CT PCT: 5.4 % — AB (ref 0.4–3.1)
Retic Count, Absolute: 150.7 10*3/uL (ref 19.0–183.0)

## 2017-05-28 LAB — BASIC METABOLIC PANEL
ANION GAP: 7 (ref 5–15)
BUN: 16 mg/dL (ref 6–20)
CO2: 22 mmol/L (ref 22–32)
Calcium: 7.9 mg/dL — ABNORMAL LOW (ref 8.9–10.3)
Chloride: 112 mmol/L — ABNORMAL HIGH (ref 101–111)
Creatinine, Ser: 0.7 mg/dL (ref 0.44–1.00)
GLUCOSE: 106 mg/dL — AB (ref 65–99)
POTASSIUM: 4.2 mmol/L (ref 3.5–5.1)
Sodium: 141 mmol/L (ref 135–145)

## 2017-05-28 LAB — PREPARE RBC (CROSSMATCH)

## 2017-05-28 LAB — FOLATE: FOLATE: 16.6 ng/mL (ref >5.9)

## 2017-05-28 SURGERY — SHOULDER ARTHROSCOPY WITH SUBACROMIAL DECOMPRESSION AND OPEN ROTATOR CUFF REPAIR, OPEN BICEPS TENDON REPAIR
Anesthesia: Regional | Laterality: Left

## 2017-05-28 MED ORDER — SODIUM CHLORIDE 0.9 % IV SOLN
Freq: Once | INTRAVENOUS | Status: AC
  Administered 2017-05-28: 15:00:00 via INTRAVENOUS

## 2017-05-28 NOTE — Progress Notes (Signed)
[place] [facility] [place] NAME: Tabitha Rice    MR#:  [id number]  DATE OF BIRTH:  09-28-1945  SUBJECTIVE:  CHIEF COMPLAINT:   Chief Complaint  Patient presents with  . Abnormal Lab  feels better REVIEW OF SYSTEMS:  Review of Systems  Constitutional: Positive for malaise/fatigue. Negative for chills, fever and weight loss.  HENT: Negative for nosebleeds and sore throat.   Eyes: Negative for blurred vision.  Respiratory: Negative for cough, shortness of breath and wheezing.   Cardiovascular: Negative for chest pain, orthopnea, leg swelling and PND.  Gastrointestinal: Negative for abdominal pain, constipation, diarrhea, heartburn, nausea and vomiting.  Genitourinary: Negative for dysuria and urgency.  Musculoskeletal: Negative for back pain.  Skin: Negative for rash.  Neurological: Positive for weakness. Negative for dizziness, speech change, focal weakness and headaches.  Endo/Heme/Allergies: Does not bruise/bleed easily.  Psychiatric/Behavioral: Negative for depression.    DRUG ALLERGIES:   Allergies  Allergen Reactions  . Ambien [Zolpidem] Itching and Rash  . Penicillin G Rash  . Penicillins Itching and Rash    Has patient had a PCN reaction causing immediate rash, facial/tongue/throat swelling, SOB or lightheadedness with hypotension: No Has patient had a PCN reaction causing severe rash involving mucus membranes or skin necrosis: No Has patient had a PCN reaction that required hospitalization: No Has patient had a PCN reaction occurring within the last 10 years: No If all of the above answers are "NO", then may proceed with Cephalosporin use.    VITALS:  Blood pressure 122/73, pulse 75, temperature 98.7 F (37.1 C), temperature source Oral, resp. rate 16, height 5\' 3"  (1.6 m), weight 59.9 kg (132 lb), SpO2 100 %. PHYSICAL EXAMINATION:  Physical Exam  Constitutional: She is oriented to person, place, and time and well-developed,  well-nourished, and in no distress.  HENT:  Head: Normocephalic and atraumatic.  Eyes: Conjunctivae and EOM are normal. Pupils are equal, round, and reactive to light.  Neck: Normal range of motion. Neck supple. No tracheal deviation present. No thyromegaly present.  Cardiovascular: Normal rate, regular rhythm and normal heart sounds.  Pulmonary/Chest: Effort normal and breath sounds normal. No respiratory distress. She has no wheezes. She exhibits no tenderness.  Abdominal: Soft. Bowel sounds are normal. She exhibits no distension. There is no tenderness.  Musculoskeletal: Normal range of motion.  Neurological: She is alert and oriented to person, place, and time. No cranial nerve deficit.  Skin: Skin is warm and dry. No rash noted.  Psychiatric: Mood and affect normal.   LABORATORY PANEL:  Female CBC Recent Labs  Lab 05/28/17 0606  WBC 9.0  HGB 7.5*  HCT 22.7*  PLT 390   ------------------------------------------------------------------------------------------------------------------ Chemistries  Recent Labs  Lab 05/27/17 1619 05/28/17 0606  NA 139 141  K 4.1 4.2  CL 108 112*  CO2 22 22  GLUCOSE 116* 106*  BUN 16 16  CREATININE 0.85 0.70  CALCIUM 8.2* 7.9*  AST 17  --   ALT 9*  --   ALKPHOS 30*  --   BILITOT 0.1*  --    RADIOLOGY:  No results found. ASSESSMENT AND PLAN:   * Symptomatic anemia - likely iron deficiency - may be absorption issue due to underlying whipple - will Transfuse one more PRBC, s/p 1 PRBC - pending Hematology consult.  * Hyperlipidemia   Cont atorvastatin  * Breast cancer   Cont tamoxifen.       All the records are reviewed and case discussed  with Care Management/Social Worker. Management plans discussed with the patient, family and they are in agreement.  CODE STATUS: Full Code  TOTAL TIME TAKING CARE OF THIS PATIENT: 35 minutes.   More than 50% of the time was spent in counseling/coordination of care: YES  POSSIBLE D/C  IN 1-2 DAYS, DEPENDING ON CLINICAL CONDITION.   Max Sane M.D on 05/28/2017 at 5:06 PM  Between 7am to 6pm - Pager - [phone]  After 6pm go to [email or web address] - Proofreader  [facility]  Office  [phone]  CC: Primary care physician; Idelle Crouch, MD  Note: This dictation was prepared with Dragon dictation along with smaller phrase technology. Any transcriptional errors that result from this process are unintentional.

## 2017-05-28 NOTE — Consult Note (Signed)
[facility]  Date of admission:  05/27/2017  Inpatient day:  05/28/2017  Consulting physician:  Vaughan Basta.  Reason for Consultation:  Iron deficiency anemia.  Chief Complaint: Tabitha Rice is a 72 y.o. female with a history of iron deficiency anemia who was admitted with symptomatic anemia.  HPI: The patient is a recent history of iron deficiency anemia.  She describes a change in diet s/p pancreaticoduodenectomy (Whipple procedure) on 09/19/2015. Pathology was negative for malignancy.  Colonoscopy was negative 4 years ago (due 2019).  One of 5 guaiac cards were positive from the [facility]. She denies any history of melena or hematochezia.  She was on oral iron 3 x a day from 01/2017 or 02/2017 until 05/20/2017.    CBC on 03/11/2017 revealed a hemoglobin of 8.5, hematocrit 27.3, MCV 80.3, and platelet count of 536,000.  Iron saturation was low at 4% with a TIBC of 262.  Ferritin was slightly low at 77.  TSH was normal on 03/11/2017.  Urinalysis was negative for blood.  B12 level was borderline low at 385.  Normal labs on 03/18/2017 included the following: DAT, folate, and MMA.  Sed rate was elevated at 116 (0-30).  Reticulocyte count was 1.4% (inappropriately low).   Her ferritin was felt to be falsely elevated secondary to her elevated sed rate.  She was started on oral iron TID.  She was seen in the hematology clinic on 04/22/2017 with improvement in counts.  CBC revealed a hemoglobin 10.0 with hematocrit 31.1. MCV was 75.4 (low).  Ferritin was 84.  She continued oral iron TID until 05/20/2017.  Preoperative evaluation for planned rotator cuff repair revealed a hemoglobin of 6.  She was referred to the ER.  CBC on 05/27/2017 revealed a hematocrit of 18.8, hemoglobin 6.0, MCV 80.7, platelets 420,000, and WBC 6600.  Iron studies included an iron saturation of 4% and a TIBC of 311. Creatinine was 0.85, calcium 8.2, albumen 3.4, protein 6.8, and normal LFTs.   LDH was 136.  She has received 1 unit of PRBCs and is in the process of receiving her second unit.  She was restarted on ferous sulfate TID and started on oral B12.  CBC on 05/28/2017 revealed a hematocrit of 22.7, hemoglobin 7.5, and MCV 81.4.  Retic was 5.4%.  Folate was 16.6 on 05/28/2017.  MMA is pending.  Symptomatically, she describes feeling weaker since discontinuation of oral iron on 05/20/2017.  She is "trying to do better" with her eating.  She states that her eating has been less because of her shoulder pain.  She denies any change in urine color, hematuria, vaginal bleeding, melena or hematochezia.  She is scheduled to have a colonoscopy in the spring.   Past Medical History:  Diagnosis Date  . Allergic rhinitis 02/12/2015  . Anemia   . Arthritis    osteoarthritis  . Arthritis, degenerative 02/12/2015  . Basal cell carcinoma of cheek 10/25/2012   Overview:  Basal cell carcinoma of left medial cheek treated with Mohs surgery on 10/25/2012.   Marland Kitchen Basal cell carcinoma of face 01/28/2012   Overview:  BCC of left medial cheek treated with Mohs surgery 01-28-12   . CA skin, basal cell 02/12/2015  . Climacteric 02/12/2015  . DDD (degenerative disc disease), cervical   . Edema, peripheral 02/12/2015  . Female genuine stress incontinence 02/12/2015  . Heart murmur   . HLD (hyperlipidemia) 02/12/2015  . Hyperlipidemia   . Injury of eyeball 02/12/2015   Overview:  LEFT RETINAL TRAUMA   . Inverted nipple    left nipple inverted, nothing new  . Osteopenia 02/12/2015  . Skin cancer, basal cell   . Stasis, venous 02/12/2015  . Wears contact lenses     Past Surgical History:  Procedure Laterality Date  . ABDOMINAL HYSTERECTOMY  1982  . APPENDECTOMY    . BREAST BIOPSY Left 11/23/2014   Procedure: BREAST BIOPSY WITH NEEDLE LOCALIZATION;  Surgeon: Leonie Green, MD;  Location: [facility] ORS;  Service: General;  Laterality: Left;  . BREAST BIOPSY Left 09/25/2014   complex sclerosing lesion  .  BREAST BIOPSY Right 01/13/2017   2 area/path pending  . BREAST EXCISIONAL BIOPSY Right 02/20/2017   path pending  . BREAST LUMPECTOMY Left 11/23/2014   complex sclerosing lesion excised.  Marland Kitchen BREAST LUMPECTOMY WITH NEEDLE LOCALIZATION Right 02/20/2017   Procedure: EXCISION OF RIGHT BREAST MASS WITH NEEDLE LOCALIZATION;  Surgeon: Leonie Green, MD;  Location: [facility] ORS;  Service: General;  Laterality: Right;  . CHOLECYSTECTOMY    . DILATION AND CURETTAGE OF UTERUS    . Partial hystrectomy  1982  . WHIPPLE PROCEDURE  2017   thought pt had a slow growing pancreatic mass but it came back benign    Family History  Problem Relation Age of Onset  . Lung cancer Mother   . Cancer Mother   . Stomach cancer Father   . CVA Father   . Cancer Father   . Lung cancer Brother   . Cancer Brother   . Bladder Cancer Neg Hx   . Prostate cancer Neg Hx   . Breast cancer Neg Hx     Social History:  reports that  has never smoked. she has never used smokeless tobacco. She reports that she does not drink alcohol or use drugs. 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 She lives in [place].  She is accompanied by her youngest daughter, Clarene Critchley.  Allergies:  Allergies  Allergen Reactions  . Ambien [Zolpidem] Itching and Rash  . Penicillin G Rash  . Penicillins Itching and Rash    Has patient had a PCN reaction causing immediate rash, facial/tongue/throat swelling, SOB or lightheadedness with hypotension: No Has patient had a PCN reaction causing severe rash involving mucus membranes or skin necrosis: No Has patient had a PCN reaction that required hospitalization: No Has patient had a PCN reaction occurring within the last 10 years: No If all of the above answers are "NO", then may proceed with Cephalosporin use.     Medications Prior to Admission  Medication Sig Dispense Refill  . acetaminophen (TYLENOL ARTHRITIS PAIN) 650 MG CR tablet Take 650 mg by  mouth every 8 (eight) hours as needed for pain.    Marland Kitchen atorvastatin (LIPITOR) 10 MG tablet Take 10 mg by mouth daily at 6 PM.    . Cholecalciferol (VITAMIN D3) 5000 units CAPS Take 5,000 Units by mouth daily.    . Cyanocobalamin (VITAMIN B12) 3000 MCG SUBL Place 3,000 Units under the tongue daily.    Marland Kitchen ibuprofen (ADVIL,MOTRIN) 200 MG tablet Take 200 mg by mouth every 6 (six) hours as needed for mild pain.    . tamoxifen (NOLVADEX) 20 MG tablet Take 1 tablet (20 mg total) by mouth daily. 90 tablet 3  . TOLTERODINE TARTRATE ER PO Take 4 mg by mouth every morning.     Marland Kitchen aspirin EC 81 MG tablet Take 81 mg by mouth daily.    Marland Kitchen  FEROSUL 325 (65 Fe) MG tablet Take 325 mg by mouth 3 (three) times daily.   1    Review of Systems: GENERAL:  Feels "weak". Since 05/20/2017  No fevers or sweats. Weight up 2 pounds since 04/22/2017.  PERFORMANCE STATUS (ECOG):  1 HEENT:  Vision change secondary to cataracts.  No runny nose, sore throat, mouth sores or tenderness. Lungs: No shortness of breath or cough.  No hemoptysis. Cardiac:  No chest pain, palpitations, orthopnea, or PND. GI:  Eating less secondary to shoulder pain.  No nausea, vomiting, diarrhea, constipation, melena or hematochezia. GU:  No urgency, frequency, dysuria, or hematuria. Musculoskeletal: Chronic shoulder pain secondary to rotator cuff.  No back pain. No muscle tenderness. Extremities:  No pain or swelling. Skin:  No rashes or skin changes. Neuro:  No headache, numbness or weakness, balance or coordination issues. Endocrine:  No diabetes, thyroid issues, hot flashes or night sweats. Psych:  No mood changes, depression or anxiety. Pain:  Shoulder pain (8 out of 10). Review of systems:  All other systems reviewed and found to be   Physical Exam:  Blood pressure 122/73, pulse 75, temperature 98.7 F (37.1 C), temperature source Oral, resp. rate 16, height _0  (1.6 m), weight 132 lb (59.9 kg), SpO2 100 %.  GENERAL:  Well developed, well  nourished, woman lying comfortably on the medical unit in no acute distress. MENTAL STATUS:  Alert and oriented to person, place and time. HEAD:Styled hair. Normocephalic, atraumatic, face symmetric, EYES:Glasses.  Blueeyes. Pupils equal round and reactive to light and accomodation. No conjunctivitis or scleral icterus. HKV:[id number] clear without lesion. Tonguenormal. Mucous membranes moist. RESPIRATORY:Clear to auscultationwithout rales, wheezes or rhonchi. CARDIOVASCULAR:Regular rate andrhythmwithout murmur, rub or gallop. ABDOMEN:Soft, non-tender, with active bowel sounds, and no hepatosplenomegaly. No masses. SKIN:No rashes, ulcers or lesions. EXTREMITIES: No edema, no skin discoloration or tenderness. No palpable cords. LYMPHNODES: No palpable cervical, supraclavicular, axillary or inguinal adenopathy  NEUROLOGICAL: Unremarkable. PSYCH: Appropriate   Results for orders placed or performed during the hospital encounter of 05/27/17 (from the past 48 hour(s))  Comprehensive metabolic panel     Status: Abnormal   Collection Time: 05/27/17  4:19 PM  Result Value Ref Range   Sodium 139 135 - 145 mmol/L   Potassium 4.1 3.5 - 5.1 mmol/L   Chloride 108 101 - 111 mmol/L   CO2 22 22 - 32 mmol/L   Glucose, Bld 116 (H) 65 - 99 mg/dL   BUN 16 6 - 20 mg/dL   Creatinine, Ser 0.85 0.44 - 1.00 mg/dL   Calcium 8.2 (L) 8.9 - 10.3 mg/dL   Total Protein 6.8 6.5 - 8.1 g/dL   Albumin 3.4 (L) 3.5 - 5.0 g/dL   AST 17 15 - 41 U/L   ALT 9 (L) 14 - 54 U/L   Alkaline Phosphatase 30 (L) 38 - 126 U/L   Total Bilirubin 0.1 (L) 0.3 - 1.2 mg/dL   GFR calc non Af Amer >60 >60 mL/min   GFR calc Af Amer >60 >60 mL/min    Comment: (NOTE) The eGFR has been calculated using the CKD EPI equation. This calculation has not been validated in all clinical situations. eGFR's persistently <60 mL/min signify possible Chronic Kidney Disease.    Anion gap 9 5 - 15    Comment: Performed at  [facility], [address] [place], [place] [address]  CBC     Status: Abnormal   Collection Time: 05/27/17  4:19 PM  Result Value Ref  Range   WBC 7.9 3.6 - 11.0 K/uL   RBC 2.39 (L) 3.80 - 5.20 MIL/uL   Hemoglobin 6.2 (L) 12.0 - 16.0 g/dL   HCT 19.5 (L) 35.0 - 47.0 %   MCV 81.6 80.0 - 100.0 fL   MCH 25.9 (L) 26.0 - 34.0 pg   MCHC 31.7 (L) 32.0 - 36.0 g/dL   RDW 22.3 (H) 11.5 - 14.5 %   Platelets 423 150 - 440 K/uL    Comment: Performed at [facility], [address] [place], [place] [address]  Lactate dehydrogenase     Status: None   Collection Time: 05/27/17  4:19 PM  Result Value Ref Range   LDH 136 98 - 192 U/L    Comment: Performed at [facility], [address] [place], [place] [address]  Iron and TIBC     Status: Abnormal   Collection Time: 05/27/17  4:19 PM  Result Value Ref Range   Iron 13 (L) 28 - 170 ug/dL   TIBC 311 250 - 450 ug/dL   Saturation Ratios 4 (L) 10.4 - 31.8 %   UIBC 298 ug/dL    Comment: Performed at [facility], [address], [place], [place] [address]  ABO/Rh     Status: None   Collection Time: 05/27/17  4:19 PM  Result Value Ref Range   ABO/RH(D)      A POS Performed at [facility], [address] [place], [place] [address]   Type and screen [place]     Status: None (Preliminary result)   Collection Time: 05/27/17  4:30 PM  Result Value Ref Range   ABO/RH(D) A POS    Antibody Screen NEG    Sample Expiration 05/30/2017    Unit Number [id number]    Blood Component Type RED CELLS,LR    Unit division 00    Status of Unit ISSUED,FINAL    Transfusion Status OK TO TRANSFUSE    Crossmatch Result Compatible    Unit Number [id number]    Blood Component Type RBC LR PHER2    Unit division 00    Status of Unit ISSUED    Transfusion Status OK TO TRANSFUSE    Crossmatch Result      Compatible Performed at [facility], [address] [place],  [place] [address]   Prepare RBC     Status: None   Collection Time: 05/27/17  7:30 PM  Result Value Ref Range   Order Confirmation      ORDER PROCESSED BY BLOOD BANK Performed at [facility], [address] [place], [place] [address]   Reticulocytes     Status: Abnormal   Collection Time: 05/28/17  6:06 AM  Result Value Ref Range   Retic Ct Pct 5.4 (H) 0.4 - 3.1 %   RBC. 2.79 (L) 3.80 - 5.20 MIL/uL   Retic Count, Absolute 150.7 19.0 - 183.0 K/uL    Comment: Performed at [facility], [address] [place],  [address]  Basic metabolic panel     Status: Abnormal   Collection Time: 05/28/17  6:06 AM  Result Value Ref Range   Sodium 141 135 - 145 mmol/L   Potassium 4.2 3.5 - 5.1 mmol/L   Chloride 112 (H) 101 - 111 mmol/L   CO2 22 22 - 32 mmol/L   Glucose, Bld 106 (H) 65 - 99 mg/dL   BUN 16 6 - 20 mg/dL   Creatinine, Ser 0.70 0.44 -  1.00 mg/dL   Calcium 7.9 (L) 8.9 - 10.3 mg/dL   GFR calc non Af Amer >60 >60 mL/min   GFR calc Af Amer >60 >60 mL/min    Comment: (NOTE) The eGFR has been calculated using the CKD EPI equation. This calculation has not been validated in all clinical situations. eGFR's persistently <60 mL/min signify possible Chronic Kidney Disease.    Anion gap 7 5 - 15    Comment: Performed at [facility], [address] [place], [place] [address]  CBC     Status: Abnormal   Collection Time: 05/28/17  6:06 AM  Result Value Ref Range   WBC 9.0 3.6 - 11.0 K/uL   RBC 2.79 (L) 3.80 - 5.20 MIL/uL   Hemoglobin 7.5 (L) 12.0 - 16.0 g/dL   HCT 22.7 (L) 35.0 - 47.0 %   MCV 81.4 80.0 - 100.0 fL   MCH 26.8 26.0 - 34.0 pg   MCHC 32.9 32.0 - 36.0 g/dL   RDW 20.0 (H) 11.5 - 14.5 %   Platelets 390 150 - 440 K/uL    Comment: Performed at [facility], [address], [place], [place] [address]  Folate     Status: None   Collection Time: 05/28/17  6:06 AM  Result Value Ref Range   Folate 16.6 >5.9 ng/mL    Comment: Performed at  [facility], [address] [place], [place] [address]  Prepare RBC     Status: None   Collection Time: 05/28/17 10:30 AM  Result Value Ref Range   Order Confirmation      ORDER PROCESSED BY BLOOD BANK Performed at [facility], [address], [place], [place] [address]    No results found.  Assessment:  The patient is a 72 y.o. woman s/p Whipple in 09/2015 with a history of iron deficiency anemia who was admitted with symptomatic anemia.  She was on oral iron from 01/2017 - 05/20/2017 with an improvement in hemoglobin from 8.5 on 03/11/2017 to 10.1 on 04/22/2017.  Work-up on 03/11/2017 revealed a hemoglobin of 8.5, hematocrit 27.3, MCV 80.3, and platelet count of 536,000.  Iron saturation was low at 4% with a TIBC of 262.  Ferritin was slightly low at 77 (likely falsely elevated secondary to high sed rate).  Sed rate was elevated at 116 (0-30). TSH was normal on 03/11/2017.  Urinalysis was negative for blood.  B12 level was borderline low at 385.  Normal labs on 03/18/2017 included the following: DAT, folate, and MMA.  Reticulocyte count was 1.4% (inappropriately low).   Colonoscopy was negative 4 years ago (due 2019).  One of 5 guaiac cards were positive from the [facility] on 03/26/2017.  She is due for colonoscopy in the spring of 2019.  Current labs:  iron saturation low at 4% with a TIBC of 311.  Retic was 5.4% (high).  Creatinine, bilirubin, LDH, and folate are normal.  MMA is pending.   She has received 1 unit of PRBCs and is in the process of receiving her second unit.  Hemoglobin has improved from 6.0 to 7.5.  She was restarted on ferous sulfate TID and started on oral B12.  Symptomatically, she describes feeling weaker since discontinuation of oral iron on 05/20/2017.  Her appetite has been poor secondary to shoulder pain.  She denies any change in urine color, hematuria, vaginal bleeding, melena or hematochezia.  Plan:   1.  Hematology:  Patient with  previously documented iron deficiency (MCV 75-80,  low iron saturation) and "normal" ferritin felt falsely elevated secondary to high sedimentation rate. Initially retic count was low in 02/2017, but now high.  Unclear why sudden drop in hemoglobin after discontinuation of oral iron.  She denies any bleeding.  No current evidence of hemolysis.  She has had an appropriate increase in hemoglobin after 1 unit of PRBCs.  Check DAT, ferritin, sed rate, and ANA.  CBC in AM after completion of second unit.  Guaiac all stools.  Continue oral iron.   2.  Disposition:  Anticipate discharge tomorrow if hemoglobin improved with plans for follow-up in the outpatient department and consideration of IV iron in preparation for upcoming rotator cuff surgery.   Thank you for allowing me to participate in HELMI HECHAVARRIA 's care.  I will follow her closely with you while hospitalized and after discharge in the outpatient department.   Lequita Asal, MD  05/28/2017, 4:46 PM

## 2017-05-29 LAB — CBC
HCT: 26.5 % — ABNORMAL LOW (ref 35.0–47.0)
HEMOGLOBIN: 8.8 g/dL — AB (ref 12.0–16.0)
MCH: 27.6 pg (ref 26.0–34.0)
MCHC: 33.2 g/dL (ref 32.0–36.0)
MCV: 83 fL (ref 80.0–100.0)
Platelets: 378 10*3/uL (ref 150–440)
RBC: 3.2 MIL/uL — ABNORMAL LOW (ref 3.80–5.20)
RDW: 19.2 % — AB (ref 11.5–14.5)
WBC: 8.3 10*3/uL (ref 3.6–11.0)

## 2017-05-29 LAB — TYPE AND SCREEN
ABO/RH(D): A POS
ANTIBODY SCREEN: NEGATIVE
Unit division: 0
Unit division: 0

## 2017-05-29 LAB — BASIC METABOLIC PANEL
Anion gap: 7 (ref 5–15)
BUN: 16 mg/dL (ref 6–20)
CALCIUM: 8.2 mg/dL — AB (ref 8.9–10.3)
CHLORIDE: 110 mmol/L (ref 101–111)
CO2: 23 mmol/L (ref 22–32)
CREATININE: 0.85 mg/dL (ref 0.44–1.00)
Glucose, Bld: 107 mg/dL — ABNORMAL HIGH (ref 65–99)
Potassium: 4.1 mmol/L (ref 3.5–5.1)
SODIUM: 140 mmol/L (ref 135–145)

## 2017-05-29 LAB — BPAM RBC
BLOOD PRODUCT EXPIRATION DATE: 201901152359
BLOOD PRODUCT EXPIRATION DATE: 201901162359
ISSUE DATE / TIME: 201901101520
ISSUE DATE / TIME: 201901092227
UNIT TYPE AND RH: 5100
UNIT TYPE AND RH: 600

## 2017-05-29 LAB — SEDIMENTATION RATE: Sed Rate: 41 mm/hr — ABNORMAL HIGH (ref 0–30)

## 2017-05-29 LAB — FERRITIN: Ferritin: 38 ng/mL (ref 11–307)

## 2017-05-29 LAB — DAT, POLYSPECIFIC AHG (ARMC ONLY): Polyspecific AHG test: NEGATIVE

## 2017-05-29 MED ORDER — SODIUM CHLORIDE 0.9 % IV SOLN: 200.0000 mg | Freq: Once | INTRAVENOUS | Status: DC

## 2017-05-29 NOTE — Discharge Instructions (Signed)

## 2017-05-29 NOTE — Progress Notes (Signed)
Patient discharged home per MD order. All discharge instructions given and all questions answered. 

## 2017-05-29 NOTE — Care Management Important Message (Signed)
Important Message  Patient Details  Name: Tabitha Rice MRN: [id number] Date of Birth: June 23, 1945   Medicare Important Message Given:  Yes    Shelbie Ammons, RN 05/29/2017, 7:12 AM

## 2017-05-29 NOTE — Plan of Care (Signed)
  Progressing Education: Ability to identify signs and symptoms of gastrointestinal bleeding will improve 05/29/2017 1134 - Progressing by Daylene Posey, RN Bowel/Gastric: Will show no signs and symptoms of gastrointestinal bleeding 05/29/2017 1134 - Progressing by Daylene Posey, RN Fluid Volume: Will show no signs and symptoms of excessive bleeding 05/29/2017 1134 - Progressing by Daylene Posey, RN Clinical Measurements: Complications related to the disease process, condition or treatment will be avoided or minimized 05/29/2017 1134 - Progressing by Daylene Posey, RN Pain Managment: General experience of comfort will improve 05/29/2017 1134 - Progressing by Daylene Posey, RN Safety: Ability to remain free from injury will improve 05/29/2017 1134 - Progressing by Daylene Posey, RN Skin Integrity: Risk for impaired skin integrity will decrease 05/29/2017 1134 - Progressing by Daylene Posey, RN

## 2017-05-30 LAB — ANA W/REFLEX IF POSITIVE: Anti Nuclear Antibody(ANA): NEGATIVE

## 2017-05-30 LAB — METHYLMALONIC ACID, SERUM: Methylmalonic Acid, Quantitative: 124 nmol/L (ref 0–378)

## 2017-06-01 NOTE — Discharge Summary (Signed)
Tabitha Rice: Tabitha Rice    MR#:  [id number]  DATE OF BIRTH:  02/15/1946  DATE OF ADMISSION:  05/27/2017   ADMITTING PHYSICIAN: Vaughan Basta, MD  DATE OF DISCHARGE: 05/29/2017  2:50 PM  PRIMARY CARE PHYSICIAN: Idelle Crouch, MD   ADMISSION DIAGNOSIS:  Anemia, unspecified type [D64.9] DISCHARGE DIAGNOSIS:  Active Problems:   Iron deficiency anemia   Symptomatic anemia  SECONDARY DIAGNOSIS:   Past Medical History:  Diagnosis Date  . Allergic rhinitis 02/12/2015  . Anemia   . Arthritis    osteoarthritis  . Arthritis, degenerative 02/12/2015  . Basal cell carcinoma of cheek 10/25/2012   Overview:  Basal cell carcinoma of left medial cheek treated with Mohs surgery on 10/25/2012.   Marland Kitchen Basal cell carcinoma of face 01/28/2012   Overview:  BCC of left medial cheek treated with Mohs surgery 01-28-12   . CA skin, basal cell 02/12/2015  . Climacteric 02/12/2015  . DDD (degenerative disc disease), cervical   . Edema, peripheral 02/12/2015  . Female genuine stress incontinence 02/12/2015  . Heart murmur   . HLD (hyperlipidemia) 02/12/2015  . Hyperlipidemia   . Injury of eyeball 02/12/2015   Overview:  LEFT RETINAL TRAUMA   . Inverted nipple    left nipple inverted, nothing new  . Osteopenia 02/12/2015  . Skin cancer, basal cell   . Stasis, venous 02/12/2015  . Wears contact lenses    HOSPITAL COURSE:  Tabitha Rice is a 72 y.o. female with a history of iron deficiency anemia who was admitted with symptomatic anemia.  * Symptomatic anemia - likely iron deficiency - may be absorption issue due to underlying whipple - She has had an appropriate increase in hemoglobin after 2 unit of PRBCs - consideration of IV iron when  in preparation for upcoming rotator cuff surgery - Patient with previously documented iron deficiency (MCV 75-80, low iron saturation) and "normal" ferritin felt falsely elevated secondary to high  sedimentation rate. Initially retic count was low in 02/2017, but now high.  Unclear why sudden drop in hemoglobin after recent discontinuation of oral iron.  She denies any bleeding.  No current evidence of hemolysis.  * Hyperlipidemia Cont atorvastatin  * Breast cancer Cont tamoxifen. DISCHARGE CONDITIONS:  stable CONSULTS OBTAINED:  Treatment Team:  Lequita Asal, MD DRUG ALLERGIES:   Allergies  Allergen Reactions  . Ambien [Zolpidem] Itching and Rash  . Penicillin G Rash  . Penicillins Itching and Rash    Has patient had a PCN reaction causing immediate rash, facial/tongue/throat swelling, SOB or lightheadedness with hypotension: No Has patient had a PCN reaction causing severe rash involving mucus membranes or skin necrosis: No Has patient had a PCN reaction that required hospitalization: No Has patient had a PCN reaction occurring within the last 10 years: No If all of the above answers are "NO", then may proceed with Cephalosporin use.    DISCHARGE MEDICATIONS:   Allergies as of 05/29/2017      Reactions   Ambien [zolpidem] Itching, Rash   Penicillin G Rash   Penicillins Itching, Rash   Has patient had a PCN reaction causing immediate rash, facial/tongue/throat swelling, SOB or lightheadedness with hypotension: No Has patient had a PCN reaction causing severe rash involving mucus membranes or skin necrosis: No Has patient had a PCN reaction that required hospitalization: No Has patient had a PCN reaction occurring within the last 10 years: No If all of  the above answers are "NO", then may proceed with Cephalosporin use.      Medication List    TAKE these medications   aspirin EC 81 MG tablet Take 81 mg by mouth daily.   atorvastatin 10 MG tablet Commonly known as:  LIPITOR Take 10 mg by mouth daily at 6 PM.   FEROSUL 325 (65 FE) MG tablet Generic drug:  ferrous sulfate Take 325 mg by mouth 3 (three) times daily.   ibuprofen 200 MG  tablet Commonly known as:  ADVIL,MOTRIN Take 200 mg by mouth every 6 (six) hours as needed for mild pain.   tamoxifen 20 MG tablet Commonly known as:  NOLVADEX Take 1 tablet (20 mg total) by mouth daily.   TOLTERODINE TARTRATE ER PO Take 4 mg by mouth every morning.   TYLENOL ARTHRITIS PAIN 650 MG CR tablet Generic drug:  acetaminophen Take 650 mg by mouth every 8 (eight) hours as needed for pain.   Vitamin B12 3000 MCG Subl Place 3,000 Units under the tongue daily.   Vitamin D3 5000 units Caps Take 5,000 Units by mouth daily.        DISCHARGE INSTRUCTIONS:   DIET:  Regular diet DISCHARGE CONDITION:  Good ACTIVITY:  Activity as tolerated OXYGEN:  Home Oxygen: No.  Oxygen Delivery: room air DISCHARGE LOCATION:  home   If you experience worsening of your admission symptoms, develop shortness of breath, life threatening emergency, suicidal or homicidal thoughts you must seek medical attention immediately by calling 911 or calling your MD immediately  if symptoms less severe.  You Must read complete instructions/literature along with all the possible adverse reactions/side effects for all the Medicines you take and that have been prescribed to you. Take any new Medicines after you have completely understood and accpet all the possible adverse reactions/side effects.   Please note  You were cared for by a hospitalist during your hospital stay. If you have any questions about your discharge medications or the care you received while you were in the hospital after you are discharged, you can call the unit and asked to speak with the hospitalist on call if the hospitalist that took care of you is not available. Once you are discharged, your primary care physician will handle any further medical issues. Please note that NO REFILLS for any discharge medications will be authorized once you are discharged, as it is imperative that you return to your primary care physician (or  establish a relationship with a primary care physician if you do not have one) for your aftercare needs so that they can reassess your need for medications and monitor your lab values.    On the day of Discharge:  VITAL SIGNS:  Blood pressure (!) 131/59, pulse 64, temperature 98.2 F (36.8 C), temperature source Oral, resp. rate 18, height 5\' 3"  (1.6 m), weight 59.9 kg (132 lb), SpO2 98 %. PHYSICAL EXAMINATION:  GENERAL:  72 y.o.-year-old patient lying in the bed with no acute distress.  EYES: Pupils equal, round, reactive to light and accommodation. No scleral icterus. Extraocular muscles intact.  HEENT: Head atraumatic, normocephalic. Oropharynx and nasopharynx clear.  NECK:  Supple, no jugular venous distention. No thyroid enlargement, no tenderness.  LUNGS: Normal breath sounds bilaterally, no wheezing, rales,rhonchi or crepitation. No use of accessory muscles of respiration.  CARDIOVASCULAR: S1, S2 normal. No murmurs, rubs, or gallops.  ABDOMEN: Soft, non-tender, non-distended. Bowel sounds present. No organomegaly or mass.  EXTREMITIES: No pedal edema, cyanosis, or clubbing.  NEUROLOGIC:  Cranial nerves II through XII are intact. Muscle strength 5/5 in all extremities. Sensation intact. Gait not checked.  PSYCHIATRIC: The patient is alert and oriented x 3.  SKIN: No obvious rash, lesion, or ulcer.  DATA REVIEW:   CBC Recent Labs  Lab 05/29/17 0516  WBC 8.3  HGB 8.8*  HCT 26.5*  PLT 378    Chemistries  Recent Labs  Lab 05/27/17 1619  05/29/17 0516  NA 139   < > 140  K 4.1   < > 4.1  CL 108   < > 110  CO2 22   < > 23  GLUCOSE 116*   < > 107*  BUN 16   < > 16  CREATININE 0.85   < > 0.85  CALCIUM 8.2*   < > 8.2*  AST 17  --   --   ALT 9*  --   --   ALKPHOS 30*  --   --   BILITOT 0.1*  --   --    < > = values in this interval not displayed.    Follow-up Information    Lequita Asal, MD. Go on 06/04/2017.   Specialty:  Hematology and Oncology Why:  at 9:00  p.m. Contact information: [place] [address] [phone]        Idelle Crouch, MD. Schedule an appointment as soon as possible for a visit in 1 week(s).   Specialty:  Internal Medicine Contact information: [place] [address] [phone]           Management plans discussed with the patient, family and they are in agreement.  CODE STATUS: Prior   TOTAL TIME TAKING CARE OF THIS PATIENT: 45 minutes.    Max Sane M.D on 06/01/2017 at 11:28 AM  Between 7am to 6pm - Pager - [phone]  After 6pm go to [email or web address] - Proofreader  [facility]  Office  [phone]  CC: Primary care physician; Idelle Crouch, MD   Note: This dictation was prepared with Dragon dictation along with smaller phrase technology. Any transcriptional errors that result from this process are unintentional.

## 2017-06-03 ENCOUNTER — Other Ambulatory Visit: Payer: Self-pay | Admitting: *Deleted

## 2017-06-03 ENCOUNTER — Inpatient Hospital Stay: Payer: Medicare Other

## 2017-06-03 ENCOUNTER — Inpatient Hospital Stay: Payer: Medicare Other | Attending: Hematology and Oncology | Admitting: Hematology and Oncology

## 2017-06-03 ENCOUNTER — Other Ambulatory Visit: Payer: Self-pay | Admitting: Hematology and Oncology

## 2017-06-03 VITALS — BP 151/79 | HR 69 | Temp 98.9°F | Resp 18 | Wt 134.7 lb

## 2017-06-03 DIAGNOSIS — D509 Iron deficiency anemia, unspecified: Secondary | ICD-10-CM

## 2017-06-03 DIAGNOSIS — N6091 Unspecified benign mammary dysplasia of right breast: Secondary | ICD-10-CM

## 2017-06-03 LAB — IRON AND TIBC
Iron: 13 ug/dL — ABNORMAL LOW (ref 28–170)
Saturation Ratios: 4 % — ABNORMAL LOW (ref 10.4–31.8)
TIBC: 312 ug/dL (ref 250–450)
UIBC: 299 ug/dL

## 2017-06-03 LAB — CBC WITH DIFFERENTIAL/PLATELET
Basophils Absolute: 0.1 10*3/uL (ref 0–0.1)
Basophils Relative: 1 %
Eosinophils Absolute: 0.2 10*3/uL (ref 0–0.7)
Eosinophils Relative: 3 %
HCT: 28.9 % — ABNORMAL LOW (ref 35.0–47.0)
Hemoglobin: 9.7 g/dL — ABNORMAL LOW (ref 12.0–16.0)
Lymphocytes Relative: 17 %
Lymphs Abs: 1.2 10*3/uL (ref 1.0–3.6)
MCH: 27.9 pg (ref 26.0–34.0)
MCHC: 33.7 g/dL (ref 32.0–36.0)
MCV: 82.8 fL (ref 80.0–100.0)
Monocytes Absolute: 0.4 10*3/uL (ref 0.2–0.9)
Monocytes Relative: 6 %
Neutro Abs: 5.3 10*3/uL (ref 1.4–6.5)
Neutrophils Relative %: 73 %
Platelets: 461 10*3/uL — ABNORMAL HIGH (ref 150–440)
RBC: 3.48 MIL/uL — ABNORMAL LOW (ref 3.80–5.20)
RDW: 18.9 % — ABNORMAL HIGH (ref 11.5–14.5)
WBC: 7.2 10*3/uL (ref 3.6–11.0)

## 2017-06-03 MED ORDER — IRON SUCROSE 20 MG/ML IV SOLN
200.0000 mg | Freq: Once | INTRAVENOUS | Status: AC
  Administered 2017-06-03: 200 mg via INTRAVENOUS
  Filled 2017-06-03: qty 10

## 2017-06-03 MED ORDER — SODIUM CHLORIDE 0.9 % IV SOLN
Freq: Once | INTRAVENOUS | Status: AC
  Administered 2017-06-03: 13:00:00 via INTRAVENOUS
  Filled 2017-06-03: qty 1000

## 2017-06-03 NOTE — Progress Notes (Signed)
Patient here today for hospital follow up.  

## 2017-06-03 NOTE — Progress Notes (Signed)
[facility] day:  06/03/2017  Chief Complaint: Tabitha Rice is a 72 y.o. female with atypical ductal hyperplasia and iron deficiency anemia who is seen for assessment after interval hospitalization for symptomatic anemia.  HPI: Patient was last seen in the medical oncology clinic on 04/22/2017.  At that time, she was doing well.  She had shoulder issues. Exam was stable. Hemoglobin was 10.0 with hematocrit 31.1. Ferritin was normal at 84.  She continued oral iron and tamoxifen.  She was admitted to [facility] from 05/27/2017 - 05/29/2017 with symptomatic anemia.  Preoperative evaluation for planned rotator cuff repair revealed a hemoglobin of 6.  She was referred to the ER.  CBC on 05/27/2017 revealed a hematocrit of 18.8, hemoglobin 6.0, MCV 80.7, platelets 420,000, and WBC 6600.  Iron studies included an iron saturation of 4% and a TIBC of 311. Creatinine was 0.85, calcium 8.2, albumen 3.4, protein 6.8, and normal LFTs.  Ferritin was 38 with a sed rate of 41 (0-30).  LDH was 136.  Coombs was negative.  ANA was negative.  Retic was 5.4%.  Folate was 16.6 on 05/28/2017.  MMA was 121 (normal).  She noted a poor diet secondary to shoulder pain.  She had stopped her oral iron 1 week prior to admission.  She received 2 unit of PRBCs.  She was restarted on ferous sulfate TID and started on oral B12.  She received Venofer 200 mg on 05/29/2017.  During the interim, she has felt better.  She is on oral iron 3x/day.  She is taking oral B12.  She is denies any bleeding.   Past Medical History:  Diagnosis Date  . Allergic rhinitis 02/12/2015  . Anemia   . Arthritis    osteoarthritis  . Arthritis, degenerative 02/12/2015  . Basal cell carcinoma of cheek 10/25/2012   Overview:  Basal cell carcinoma of left medial cheek treated with Mohs surgery on 10/25/2012.   Marland Kitchen Basal cell carcinoma of face 01/28/2012   Overview:  BCC of left medial cheek treated with Mohs surgery  01-28-12   . CA skin, basal cell 02/12/2015  . Climacteric 02/12/2015  . DDD (degenerative disc disease), cervical   . Edema, peripheral 02/12/2015  . Female genuine stress incontinence 02/12/2015  . Heart murmur   . HLD (hyperlipidemia) 02/12/2015  . Hyperlipidemia   . Injury of eyeball 02/12/2015   Overview:  LEFT RETINAL TRAUMA   . Inverted nipple    left nipple inverted, nothing new  . Osteopenia 02/12/2015  . Skin cancer, basal cell   . Stasis, venous 02/12/2015  . Wears contact lenses     Past Surgical History:  Procedure Laterality Date  . ABDOMINAL HYSTERECTOMY  1982  . APPENDECTOMY    . BREAST BIOPSY Left 11/23/2014   Procedure: BREAST BIOPSY WITH NEEDLE LOCALIZATION;  Surgeon: Leonie Green, MD;  Location: [facility] ORS;  Service: General;  Laterality: Left;  . BREAST BIOPSY Left 09/25/2014   complex sclerosing lesion  . BREAST BIOPSY Right 01/13/2017   2 area/path pending  . BREAST EXCISIONAL BIOPSY Right 02/20/2017   path pending  . BREAST LUMPECTOMY Left 11/23/2014   complex sclerosing lesion excised.  Marland Kitchen BREAST LUMPECTOMY WITH NEEDLE LOCALIZATION Right 02/20/2017   Procedure: EXCISION OF RIGHT BREAST MASS WITH NEEDLE LOCALIZATION;  Surgeon: Leonie Green, MD;  Location: [facility] ORS;  Service: General;  Laterality: Right;  . CHOLECYSTECTOMY    . DILATION AND CURETTAGE OF UTERUS    .  ESOPHAGOGASTRODUODENOSCOPY (EGD) WITH PROPOFOL N/A 06/12/2017   Procedure: ESOPHAGOGASTRODUODENOSCOPY (EGD) WITH PROPOFOL;  Surgeon: Manya Silvas, MD;  Location: [facility] ENDOSCOPY;  Service: Endoscopy;  Laterality: N/A;  . Partial hystrectomy  1982  . WHIPPLE PROCEDURE  2017   thought pt had a slow growing pancreatic mass but it came back benign    Family History  Problem Relation Age of Onset  . Lung cancer Mother   . Cancer Mother   . Stomach cancer Father   . CVA Father   . Cancer Father   . Lung cancer Brother   . Cancer Brother   . Bladder Cancer Neg Hx   . Prostate cancer  Neg Hx   . Breast cancer Neg Hx     Social History:  reports that  has never smoked. she has never used smokeless tobacco. She reports that she does not drink alcohol or use drugs.  Patient has never smoked or used alcohol. She denies any known exposures to radiations or toxins. 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 She lives in [place]. The patient is alone today.  Allergies:  Allergies  Allergen Reactions  . Ambien [Zolpidem] Itching and Rash  . Penicillin G Rash  . Penicillins Itching and Rash    Has patient had a PCN reaction causing immediate rash, facial/tongue/throat swelling, SOB or lightheadedness with hypotension: No Has patient had a PCN reaction causing severe rash involving mucus membranes or skin necrosis: No Has patient had a PCN reaction that required hospitalization: No Has patient had a PCN reaction occurring within the last 10 years: No If all of the above answers are "NO", then may proceed with Cephalosporin use.     Current Medications: Current Outpatient Medications  Medication Sig Dispense Refill  . acetaminophen (TYLENOL ARTHRITIS PAIN) 650 MG CR tablet Take 650 mg by mouth every 8 (eight) hours as needed for pain.    Marland Kitchen aspirin EC 81 MG tablet Take 81 mg by mouth daily.    Marland Kitchen atorvastatin (LIPITOR) 10 MG tablet Take 10 mg by mouth daily at 6 PM.    . Cholecalciferol (VITAMIN D3) 5000 units CAPS Take 5,000 Units by mouth daily.    . Cyanocobalamin (VITAMIN B12) 3000 MCG SUBL Place 3,000 Units under the tongue daily.    Marland Kitchen ibuprofen (ADVIL,MOTRIN) 200 MG tablet Take 200 mg by mouth every 6 (six) hours as needed for mild pain.    . tamoxifen (NOLVADEX) 20 MG tablet Take 1 tablet (20 mg total) by mouth daily. 90 tablet 3  . TOLTERODINE TARTRATE ER PO Take 4 mg by mouth every morning.      No current facility-administered medications for this visit.     Review of Systems:  GENERAL:  Feels "better".  No fevers or  sweats. Weight up 4 pounds.  PERFORMANCE STATUS (ECOG):  1 HEENT:  Hoarse voice when dry.  Vision change secondary to cataracts.  No runny nose, sore throat, mouth sores or tenderness. Lungs: No shortness of breath or cough.  No hemoptysis. Cardiac:  No chest pain, palpitations, orthopnea, or PND. GI:  No nausea, vomiting, diarrhea, constipation, melena or hematochezia. GU:  No urgency, frequency, dysuria, or hematuria. Musculoskeletal: Chronic shoulder pain.  No back pain. No muscle tenderness. Extremities:  No pain or swelling. Skin:  No rashes or skin changes. Neuro:  No headache, numbness or weakness, balance or coordination issues. Endocrine:  No diabetes, thyroid issues, hot flashes or night sweats. Psych:  No mood changes, depression or anxiety. Pain:  No pain today. Review of systems:  All other systems reviewed and found to be negative.  Physical Exam: Blood pressure (!) 151/79, pulse 69, temperature 98.9 F (37.2 C), temperature source Tympanic, resp. rate 18, weight 134 lb 11.2 oz (61.1 kg). GENERAL:  Well developed, well nourished, woman sitting comfortably in the exam room in no acute distress. MENTAL STATUS:  Alert and oriented to person, place and time. HEAD:  Curly gray hair.  Normocephalic, atraumatic, face symmetric, EYES:  Blue eyes.  Pupils equal round and reactive to light and accomodation.  No conjunctivitis or scleral icterus. ENT:  Oropharynx clear without lesion.  Tongue normal. Mucous membranes moist.  RESPIRATORY:  Clear to auscultation without rales, wheezes or rhonchi. CARDIOVASCULAR:  Regular rate and rhythm without murmur, rub or gallop. ABDOMEN:  Soft, non-tender, with active bowel sounds, and no hepatosplenomegaly.  No masses. SKIN:  No rashes, ulcers or lesions. EXTREMITIES: No edema, no skin discoloration or tenderness.  No palpable cords. LYMPH NODES: No palpable cervical, supraclavicular, axillary or inguinal adenopathy  NEUROLOGICAL:  Unremarkable. PSYCH:  Appropriate   Appointment on 06/03/2017  Component Date Value Ref Range Status  . Iron 06/03/2017 13* 28 - 170 ug/dL Final  . TIBC 06/03/2017 312  250 - 450 ug/dL Final  . Saturation Ratios 06/03/2017 4* 10.4 - 31.8 % Final  . UIBC 06/03/2017 299  ug/dL Final   Performed at [facility], [address], [place], [place] [address]  . WBC 06/03/2017 7.2  3.6 - 11.0 K/uL Final  . RBC 06/03/2017 3.48* 3.80 - 5.20 MIL/uL Final  . Hemoglobin 06/03/2017 9.7* 12.0 - 16.0 g/dL Final  . HCT 06/03/2017 28.9* 35.0 - 47.0 % Final  . MCV 06/03/2017 82.8  80.0 - 100.0 fL Final  . MCH 06/03/2017 27.9  26.0 - 34.0 pg Final  . MCHC 06/03/2017 33.7  32.0 - 36.0 g/dL Final  . RDW 06/03/2017 18.9* 11.5 - 14.5 % Final  . Platelets 06/03/2017 461* 150 - 440 K/uL Final  . Neutrophils Relative % 06/03/2017 73  % Final  . Neutro Abs 06/03/2017 5.3  1.4 - 6.5 K/uL Final  . Lymphocytes Relative 06/03/2017 17  % Final  . Lymphs Abs 06/03/2017 1.2  1.0 - 3.6 K/uL Final  . Monocytes Relative 06/03/2017 6  % Final  . Monocytes Absolute 06/03/2017 0.4  0.2 - 0.9 K/uL Final  . Eosinophils Relative 06/03/2017 3  % Final  . Eosinophils Absolute 06/03/2017 0.2  0 - 0.7 K/uL Final  . Basophils Relative 06/03/2017 1  % Final  . Basophils Absolute 06/03/2017 0.1  0 - 0.1 K/uL Final   Performed at [facility], [address] [place], [place] [address]    Assessment:  Tabitha Rice is a 72 y.o. female with atypical ductal hyperplasia (ADH) s/p excision of the 2 right breast lesions on 02/20/2017.  The upper right lesion revealed biopsy cavity with residual changes of radial scar. There was columnar cell change with calcification. There was sclerosing adenosis, negative for atypia and malignancy.  The right central breast lesion revealed a small focus of atypical ductal hyperplasia (ADH), margins were negative. There is biopsy cavity with residual changes of radial scar.    Estimated breast cancer risk by the [facility] model is 4.6%.  She began tamoxifen on 03/25/2017.  She is tolerating it well.  She has a recent history of a normocytic anemia and associated thrombocytosis.  CBC  on 03/11/2017 revealed a hemoglobin of 8.5, hematocrit 27.3, MCV 80.3, and platelet count of 536,000.  Iron saturations was low at 4% with a TIBC of 262.  Ferritin was slightly low at 77.  TSH was normal on 03/11/2017.  Urinlaysis was negative for blood.  B12 level was borderline low at 385.  Normal labs on 03/18/2017 included the following: DAT, folate, and MMA.  Sed rate was elevated at 116 (0-30).  Reticulocyte count was 1.4% (inappropriately low).   Ferritin has been followed: 77 on 03/11/2017, 84 on 04/21/2017, and 38 on 05/29/2017.  She has an elevated sed rate (116 on 03/18/2017 and 41 on 05/29/2017).  She received 2 unit of PRBCs during her admission (05/27/2017 - 05/29/2017). She received Venofer 200 mg on 05/29/2017.  She was admitted to [facility] from 05/27/2017 - 05/29/2017 with symptomatic anemia.  CBC revealed a hematocrit of 18.8, hemoglobin 6.0, and MCV 80.7.  Iron saturation was 4% (low) and a TIBC of 311. Creatinine was 0.85.  Ferritin was 38 with a sed rate of 41 (0-30).  LDH was 136.  Coombs was negative.  ANA was negative.  Retic was 5.4%.  Folate and MMA were normal. She was restarted on ferous sulfate TID and started on oral B12.  Patient underwent a pancreaticoduodenectomy (Whipple procedure) on 09/19/2015. Pathology was negative for malignancy.  Colonoscopy was negative 4 years ago (due 2019).  One of 5 guaiac cards were positive from the [facility]. She denies an melena or hematochezia.  Diet has been poor secondary to shoulder related pain.  She denies ice pica.  She is on oral iron 3 x a day.    Symptomatically, she feels better.  Patient has a rotator cuff tendinopathy and is awaiting surgery.  Exam stable.  Hemoglobin is 9.7 with hematocrit 28.9.  Ferritin is 38 (low) and  may be falsely elevated secondary to elevated sed rate.    Plan: 1.  Review interval hospitalization.  Unexplained acute drop in hemoglobin.  No bleeding documented.  No evidence of hemolysis.  Ferrin had decreased from 84 to 38.  Ferritin may be falsely elevated secondary to elevated sed rate.  Discuss continuation of IV iron in preparation for surgery.  Ferritin goal 100 with normal iron saturation. 2.  Labs today:  CBC with diff, iron studies. 3.  Patient to follow-up with Dr. Vira Agar for  regarding need for endoscopy secondary to need for transfusion and iron deficiency anemia.   4.  Venofer 200 mg IV today and weekly x 2. 5.  RTC in 1 month for MD assessment, labs (CBC with diff, ferritin, iron studies, sed rate- day before), and +/- Venofer.   Lequita Asal, MD  06/03/2017, 4:35 PM

## 2017-06-04 ENCOUNTER — Ambulatory Visit: Payer: Medicare Other | Admitting: Hematology and Oncology

## 2017-06-10 ENCOUNTER — Inpatient Hospital Stay: Payer: Medicare Other

## 2017-06-10 VITALS — BP 134/76 | HR 73 | Temp 97.3°F | Resp 18

## 2017-06-10 DIAGNOSIS — D509 Iron deficiency anemia, unspecified: Secondary | ICD-10-CM

## 2017-06-10 MED ORDER — SODIUM CHLORIDE 0.9 % IV SOLN
Freq: Once | INTRAVENOUS | Status: AC
  Administered 2017-06-10: 10:00:00 via INTRAVENOUS
  Filled 2017-06-10: qty 1000

## 2017-06-10 MED ORDER — IRON SUCROSE 20 MG/ML IV SOLN
200.0000 mg | Freq: Once | INTRAVENOUS | Status: AC
  Administered 2017-06-10: 200 mg via INTRAVENOUS
  Filled 2017-06-10: qty 10

## 2017-06-10 NOTE — Patient Instructions (Signed)

## 2017-06-11 ENCOUNTER — Encounter: Payer: Self-pay | Admitting: *Deleted

## 2017-06-12 ENCOUNTER — Ambulatory Visit: Payer: Medicare Other | Admitting: Anesthesiology

## 2017-06-12 ENCOUNTER — Encounter
Admission: RE | Disposition: A | Discharge status: Home / Self Care | Payer: Self-pay | Source: Ambulatory Visit | Attending: Unknown Physician Specialty

## 2017-06-12 ENCOUNTER — Encounter: Payer: Self-pay | Admitting: *Deleted

## 2017-06-12 ENCOUNTER — Ambulatory Visit
Admission: RE | Admit: 2017-06-12 | Discharge: 2017-06-12 | Disposition: A | Discharge status: Home / Self Care | Payer: Medicare Other | Source: Ambulatory Visit | Attending: Unknown Physician Specialty | Admitting: Unknown Physician Specialty

## 2017-06-12 DIAGNOSIS — D509 Iron deficiency anemia, unspecified: Secondary | ICD-10-CM | POA: Insufficient documentation

## 2017-06-12 DIAGNOSIS — Z801 Family history of malignant neoplasm of trachea, bronchus and lung: Secondary | ICD-10-CM | POA: Insufficient documentation

## 2017-06-12 DIAGNOSIS — Z888 Allergy status to other drugs, medicaments and biological substances status: Secondary | ICD-10-CM | POA: Insufficient documentation

## 2017-06-12 DIAGNOSIS — Z880 Allergy status to penicillin: Secondary | ICD-10-CM | POA: Insufficient documentation

## 2017-06-12 DIAGNOSIS — Z800 Family history of malignant neoplasm of digestive organs: Secondary | ICD-10-CM | POA: Diagnosis not present

## 2017-06-12 DIAGNOSIS — Z85828 Personal history of other malignant neoplasm of skin: Secondary | ICD-10-CM | POA: Diagnosis not present

## 2017-06-12 DIAGNOSIS — Z90411 Acquired partial absence of pancreas: Secondary | ICD-10-CM | POA: Diagnosis not present

## 2017-06-12 DIAGNOSIS — Z7982 Long term (current) use of aspirin: Secondary | ICD-10-CM | POA: Insufficient documentation

## 2017-06-12 DIAGNOSIS — Z823 Family history of stroke: Secondary | ICD-10-CM | POA: Insufficient documentation

## 2017-06-12 DIAGNOSIS — M8580 Other specified disorders of bone density and structure, unspecified site: Secondary | ICD-10-CM | POA: Diagnosis not present

## 2017-06-12 DIAGNOSIS — M1990 Unspecified osteoarthritis, unspecified site: Secondary | ICD-10-CM | POA: Diagnosis not present

## 2017-06-12 DIAGNOSIS — Z791 Long term (current) use of non-steroidal anti-inflammatories (NSAID): Secondary | ICD-10-CM | POA: Insufficient documentation

## 2017-06-12 DIAGNOSIS — E785 Hyperlipidemia, unspecified: Secondary | ICD-10-CM | POA: Insufficient documentation

## 2017-06-12 HISTORY — PX: ESOPHAGOGASTRODUODENOSCOPY (EGD) WITH PROPOFOL: SHX5813

## 2017-06-12 SURGERY — ESOPHAGOGASTRODUODENOSCOPY (EGD) WITH PROPOFOL
Anesthesia: General

## 2017-06-12 MED ORDER — PROPOFOL 500 MG/50ML IV EMUL
INTRAVENOUS | Status: DC | PRN
  Administered 2017-06-12: 140 ug/kg/min via INTRAVENOUS

## 2017-06-12 MED ORDER — PROPOFOL 500 MG/50ML IV EMUL
INTRAVENOUS | Status: AC
  Filled 2017-06-12: qty 50

## 2017-06-12 MED ORDER — FENTANYL CITRATE (PF) 100 MCG/2ML IJ SOLN: INTRAMUSCULAR | Status: DC | PRN

## 2017-06-12 MED ORDER — SODIUM CHLORIDE 0.9 % IV SOLN: INTRAVENOUS | Status: DC

## 2017-06-12 MED ORDER — ONDANSETRON HCL 4 MG/2ML IJ SOLN
INTRAMUSCULAR | Status: DC | PRN
  Administered 2017-06-12: 4 mg via INTRAVENOUS

## 2017-06-12 MED ORDER — PROPOFOL 10 MG/ML IV BOLUS
INTRAVENOUS | Status: DC | PRN
  Administered 2017-06-12: 50 mg via INTRAVENOUS

## 2017-06-12 MED ORDER — LIDOCAINE HCL (PF) 2 % IJ SOLN
INTRAMUSCULAR | Status: AC
  Filled 2017-06-12: qty 10

## 2017-06-12 MED ORDER — SODIUM CHLORIDE 0.9 % IV SOLN
INTRAVENOUS | Status: DC
  Administered 2017-06-12: 1000 mL via INTRAVENOUS
  Administered 2017-06-12: 11:00:00 via INTRAVENOUS

## 2017-06-12 MED ORDER — FENTANYL CITRATE (PF) 100 MCG/2ML IJ SOLN
INTRAMUSCULAR | Status: AC
  Filled 2017-06-12: qty 2

## 2017-06-12 MED ORDER — FENTANYL CITRATE (PF) 100 MCG/2ML IJ SOLN
INTRAMUSCULAR | Status: DC | PRN
  Administered 2017-06-12: 50 ug via INTRAVENOUS

## 2017-06-12 MED ORDER — LIDOCAINE HCL (CARDIAC) 20 MG/ML IV SOLN
INTRAVENOUS | Status: DC | PRN
  Administered 2017-06-12: 100 mg via INTRAVENOUS

## 2017-06-12 MED ORDER — ONDANSETRON HCL 4 MG/2ML IJ SOLN
INTRAMUSCULAR | Status: AC
  Filled 2017-06-12: qty 2

## 2017-06-12 NOTE — Anesthesia Procedure Notes (Signed)
Date/Time: 06/12/2017 10:50 AM Performed by: Allean Found, CRNA Pre-anesthesia Checklist: Patient identified, Emergency Drugs available, Suction available, Timeout performed and Patient being monitored Patient Re-evaluated:Patient Re-evaluated prior to induction Oxygen Delivery Method: Nasal cannula Placement Confirmation: positive ETCO2

## 2017-06-12 NOTE — Anesthesia Postprocedure Evaluation (Signed)
Anesthesia Post Note  Patient: Tabitha Rice  Procedure(s) Performed: ESOPHAGOGASTRODUODENOSCOPY (EGD) WITH PROPOFOL (N/A )  Patient location during evaluation: Endoscopy Anesthesia Type: General Level of consciousness: awake and alert Pain management: pain level controlled Vital Signs Assessment: post-procedure vital signs reviewed and stable Respiratory status: spontaneous breathing and respiratory function stable Cardiovascular status: stable Anesthetic complications: no     Last Vitals:  Vitals:   06/12/17 0925 06/12/17 1119  BP: (!) 159/87 132/65  Pulse: 73   Resp: 18   Temp: (!) 36.3 C (!) 36.2 C  SpO2: 100%     Last Pain:  Vitals:   06/12/17 1119  TempSrc: Tympanic                 KEPHART,WILLIAM K

## 2017-06-12 NOTE — Anesthesia Post-op Follow-up Note (Signed)
Anesthesia QCDR form completed.        

## 2017-06-12 NOTE — Transfer of Care (Signed)
Immediate Anesthesia Transfer of Care Note  Patient: Tabitha Rice  Procedure(s) Performed: ESOPHAGOGASTRODUODENOSCOPY (EGD) WITH PROPOFOL (N/A )  Patient Location: PACU  Anesthesia Type:General  Level of Consciousness: awake  Airway & Oxygen Therapy: Patient Spontanous Breathing and Patient connected to nasal cannula oxygen  Post-op Assessment: Report given to RN and Post -op Vital signs reviewed and stable  Post vital signs: Reviewed and stable  Last Vitals:  Vitals:   06/12/17 0925  BP: (!) 159/87  Pulse: 73  Resp: 18  Temp: (!) 36.3 C  SpO2: 100%    Last Pain:  Vitals:   06/12/17 0925  TempSrc: Tympanic         Complications: No apparent anesthesia complications

## 2017-06-12 NOTE — Anesthesia Preprocedure Evaluation (Addendum)
Anesthesia Evaluation  Patient identified by MRN, date of birth, ID band Patient awake    Reviewed: Allergy & Precautions, NPO status , Patient's Chart, lab work & pertinent test results  History of Anesthesia Complications Negative for: history of anesthetic complications  Airway Mallampati: II       Dental   Pulmonary neg sleep apnea, neg COPD,           Cardiovascular (-) hypertension(-) Past MI and (-) CHF (-) dysrhythmias + Valvular Problems/Murmurs (murmur, no tx)      Neuro/Psych neg Seizures    GI/Hepatic Neg liver ROS, neg GERD  ,  Endo/Other  neg diabetes  Renal/GU negative Renal ROS     Musculoskeletal   Abdominal   Peds  Hematology  (+) anemia ,   Anesthesia Other Findings   Reproductive/Obstetrics                             Anesthesia Physical Anesthesia Plan  ASA: II  Anesthesia Plan:    Post-op Pain Management:    Induction:   PONV Risk Score and Plan: 2 and TIVA and Propofol infusion  Airway Management Planned: Nasal Cannula  Additional Equipment:   Intra-op Plan:   Post-operative Plan:   Informed Consent: I have reviewed the patients History and Physical, chart, labs and discussed the procedure including the risks, benefits and alternatives for the proposed anesthesia with the patient or authorized representative who has indicated his/her understanding and acceptance.     Plan Discussed with:   Anesthesia Plan Comments:        Anesthesia Quick Evaluation

## 2017-06-12 NOTE — H&P (Signed)
Primary Care Physician:  Idelle Crouch, MD Primary Gastroenterologist:  Dr. Vira Agar  Pre-Procedure History & Physical: HPI:  Tabitha Rice is a 72 y.o. female is here for an endoscopy.   Past Medical History:  Diagnosis Date  . Allergic rhinitis 02/12/2015  . Anemia   . Arthritis    osteoarthritis  . Arthritis, degenerative 02/12/2015  . Basal cell carcinoma of cheek 10/25/2012   Overview:  Basal cell carcinoma of left medial cheek treated with Mohs surgery on 10/25/2012.   Marland Kitchen Basal cell carcinoma of face 01/28/2012   Overview:  BCC of left medial cheek treated with Mohs surgery 01-28-12   . CA skin, basal cell 02/12/2015  . Climacteric 02/12/2015  . DDD (degenerative disc disease), cervical   . Edema, peripheral 02/12/2015  . Female genuine stress incontinence 02/12/2015  . Heart murmur   . HLD (hyperlipidemia) 02/12/2015  . Hyperlipidemia   . Injury of eyeball 02/12/2015   Overview:  LEFT RETINAL TRAUMA   . Inverted nipple    left nipple inverted, nothing new  . Osteopenia 02/12/2015  . Skin cancer, basal cell   . Stasis, venous 02/12/2015  . Wears contact lenses     Past Surgical History:  Procedure Laterality Date  . ABDOMINAL HYSTERECTOMY  1982  . APPENDECTOMY    . BREAST BIOPSY Left 11/23/2014   Procedure: BREAST BIOPSY WITH NEEDLE LOCALIZATION;  Surgeon: Leonie Green, MD;  Location: [facility] ORS;  Service: General;  Laterality: Left;  . BREAST BIOPSY Left 09/25/2014   complex sclerosing lesion  . BREAST BIOPSY Right 01/13/2017   2 area/path pending  . BREAST EXCISIONAL BIOPSY Right 02/20/2017   path pending  . BREAST LUMPECTOMY Left 11/23/2014   complex sclerosing lesion excised.  Marland Kitchen BREAST LUMPECTOMY WITH NEEDLE LOCALIZATION Right 02/20/2017   Procedure: EXCISION OF RIGHT BREAST MASS WITH NEEDLE LOCALIZATION;  Surgeon: Leonie Green, MD;  Location: [facility] ORS;  Service: General;  Laterality: Right;  . CHOLECYSTECTOMY    . DILATION AND CURETTAGE OF UTERUS    .  Partial hystrectomy  1982  . 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 Authorizing Provider  acetaminophen (TYLENOL ARTHRITIS PAIN) 650 MG CR tablet Take 650 mg by mouth every 8 (eight) hours as needed for pain.    [provider]  aspirin EC 81 MG tablet Take 81 mg by mouth daily.    [provider]  atorvastatin (LIPITOR) 10 MG tablet Take 10 mg by mouth daily at 6 PM.    [provider]  Cholecalciferol (VITAMIN D3) 5000 units CAPS Take 5,000 Units by mouth daily.    [provider]  Cyanocobalamin (VITAMIN B12) 3000 MCG SUBL Place 3,000 Units under the tongue daily.    [provider]  ibuprofen (ADVIL,MOTRIN) 200 MG tablet Take 200 mg by mouth every 6 (six) hours as needed for mild pain.    [provider]  tamoxifen (NOLVADEX) 20 MG tablet Take 1 tablet (20 mg total) by mouth daily. 04/22/17   Karen Kitchens, NP  TOLTERODINE TARTRATE ER PO Take 4 mg by mouth every morning.     [provider]    Allergies as of 06/10/2017 - Review Complete 06/03/2017  Allergen Reaction Noted  . Ambien [zolpidem] Itching and Rash 11/14/2014  . Penicillin g Rash 02/28/2014  .  Penicillins Itching and Rash 11/14/2014    Family History  Problem Relation Age of Onset  . Lung cancer Mother   . Cancer Mother   . Stomach cancer Father   . CVA Father   . Cancer Father   . Lung cancer Brother   . Cancer Brother   . Bladder Cancer Neg Hx   . Prostate cancer Neg Hx   . Breast cancer Neg Hx     Social History   Socioeconomic History  . Marital status: Married    Spouse name: Not on file  . Number of children: Not on file  . Years of education: Not on file  . Highest education level: Not on file  Social Needs  . Financial resource strain: Not on file  . Food insecurity - worry: Not on file  . Food  insecurity - inability: Not on file  . Transportation needs - medical: Not on file  . Transportation needs - non-medical: Not on file  Occupational History  . Not on file  Tobacco Use  . Smoking status: Never Smoker  . Smokeless tobacco: Never Used  Substance and Sexual Activity  . Alcohol use: No  . Drug use: No  . Sexual activity: Not on file  Other Topics Concern  . Not on file  Social History Narrative  . Not on file    Review of Systems: See HPI, otherwise negative ROS  Physical Exam: BP (!) 159/87   Pulse 73   Temp (!) 97.3 F (36.3 C) (Tympanic)   Resp 18   Ht 5\' 3"  (1.6 m)   Wt 60.8 kg (134 lb)   SpO2 100%   BMI 23.74 kg/m  General:   Alert,  pleasant and cooperative in NAD Head:  Normocephalic and atraumatic. Neck:  Supple; no masses or thyromegaly. Lungs:  Clear throughout to auscultation.    Heart:  Regular rate and rhythm. Abdomen:  Soft, nontender and nondistended. Normal bowel sounds, without guarding, and without rebound.   Neurologic:  Alert and  oriented x4;  grossly normal neurologically.  Impression/Plan: Tabitha Rice is here for an endoscopy to be performed for iron def anemia.  Risks, benefits, limitations, and alternatives regarding  endoscopy have been reviewed with the patient.  Questions have been answered.  All parties agreeable.   Gaylyn Cheers, MD  06/12/2017, 10:47 AM

## 2017-06-12 NOTE — Op Note (Signed)
[facility] Patient Name: Tabitha Rice Procedure Date: 06/12/2017 10:51 AM MRN: [id number] Account #: [id number] Date of Birth: 07-Jul-1945 Admit Type: Outpatient Age: 72 Room: [facility] ENDO ROOM 3 Gender: Female Note Status: Finalized Procedure:            Upper GI endoscopy Indications:          Iron deficiency anemia Providers:            Manya Silvas, MD Referring MD:         Leonie Douglas. Doy Hutching, MD (Referring MD) Medicines:            Propofol per Anesthesia Complications:        No immediate complications. Procedure:            Pre-Anesthesia Assessment:                       - After reviewing the risks and benefits, the patient                        was deemed in satisfactory condition to undergo the                        procedure.                       After obtaining informed consent, the endoscope was                        passed under direct vision. Throughout the procedure,                        the patient's blood pressure, pulse, and oxygen                        saturations were monitored continuously. The Endoscope                        was introduced through the mouth, and advanced to the                        jejunum. The upper GI endoscopy was accomplished                        without difficulty. The patient tolerated the procedure                        well. Findings:      The examined esophagus was normal. Irritation at GEJ seen, no ulcer, no       bleeding.      A deformity was found in the gastric antrum. This was due to previous       Whipple surgery. The mucosa was grannular and somewhat easily irritated       by simple foot peddle lavage, especially near the anastamosis and       entrance into smally intestine. The scope was passed deep into small       bowel and no pathological abnormality seen. Impression:           - Normal esophagus.                       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                    - No specimens collected. Recommendation:       - The findings and recommendations were discussed with                        the patient's family. Recommend episodic iron                        transfusions to maintain hgb level. Manya Silvas, MD 06/12/2017 11:16:44 AM This report has been signed electronically. Number of Addenda: 0 Note Initiated On: 06/12/2017 10:51 AM      [facility]

## 2017-06-15 ENCOUNTER — Encounter: Payer: Self-pay | Admitting: Unknown Physician Specialty

## 2017-06-17 ENCOUNTER — Inpatient Hospital Stay: Payer: Medicare Other

## 2017-06-17 VITALS — BP 159/70 | HR 72 | Temp 98.7°F | Resp 18

## 2017-06-17 DIAGNOSIS — D509 Iron deficiency anemia, unspecified: Secondary | ICD-10-CM

## 2017-06-17 MED ORDER — SODIUM CHLORIDE 0.9 % IV SOLN
Freq: Once | INTRAVENOUS | Status: AC
Start: 2017-06-17 — End: 2017-06-17
  Administered 2017-06-17: 09:00:00 via INTRAVENOUS
  Filled 2017-06-17: qty 1000

## 2017-06-17 MED ORDER — IRON SUCROSE 20 MG/ML IV SOLN
200.0000 mg | Freq: Once | INTRAVENOUS | Status: AC
  Administered 2017-06-17: 200 mg via INTRAVENOUS
  Filled 2017-06-17: qty 10

## 2017-06-20 ENCOUNTER — Encounter: Payer: Self-pay | Admitting: Hematology and Oncology

## 2017-06-23 ENCOUNTER — Encounter
Admission: RE | Admit: 2017-06-23 | Discharge: 2017-06-23 | Disposition: A | Discharge status: Home / Self Care | Payer: Medicare Other | Source: Ambulatory Visit | Attending: Orthopedic Surgery | Admitting: Orthopedic Surgery

## 2017-06-23 ENCOUNTER — Other Ambulatory Visit: Payer: Self-pay

## 2017-06-23 DIAGNOSIS — M8580 Other specified disorders of bone density and structure, unspecified site: Secondary | ICD-10-CM | POA: Diagnosis not present

## 2017-06-23 DIAGNOSIS — M19011 Primary osteoarthritis, right shoulder: Secondary | ICD-10-CM | POA: Diagnosis not present

## 2017-06-23 DIAGNOSIS — M7522 Bicipital tendinitis, left shoulder: Secondary | ICD-10-CM | POA: Diagnosis not present

## 2017-06-23 DIAGNOSIS — M7552 Bursitis of left shoulder: Secondary | ICD-10-CM | POA: Diagnosis not present

## 2017-06-23 DIAGNOSIS — Z85828 Personal history of other malignant neoplasm of skin: Secondary | ICD-10-CM | POA: Diagnosis not present

## 2017-06-23 DIAGNOSIS — Z880 Allergy status to penicillin: Secondary | ICD-10-CM | POA: Diagnosis not present

## 2017-06-23 DIAGNOSIS — K219 Gastro-esophageal reflux disease without esophagitis: Secondary | ICD-10-CM | POA: Diagnosis not present

## 2017-06-23 DIAGNOSIS — M7581 Other shoulder lesions, right shoulder: Secondary | ICD-10-CM | POA: Diagnosis not present

## 2017-06-23 DIAGNOSIS — Z7982 Long term (current) use of aspirin: Secondary | ICD-10-CM | POA: Diagnosis not present

## 2017-06-23 DIAGNOSIS — M7542 Impingement syndrome of left shoulder: Secondary | ICD-10-CM | POA: Diagnosis not present

## 2017-06-23 DIAGNOSIS — M7541 Impingement syndrome of right shoulder: Secondary | ICD-10-CM | POA: Diagnosis not present

## 2017-06-23 DIAGNOSIS — M65812 Other synovitis and tenosynovitis, left shoulder: Secondary | ICD-10-CM | POA: Diagnosis not present

## 2017-06-23 DIAGNOSIS — Z79899 Other long term (current) drug therapy: Secondary | ICD-10-CM | POA: Diagnosis not present

## 2017-06-23 DIAGNOSIS — M19012 Primary osteoarthritis, left shoulder: Secondary | ICD-10-CM | POA: Diagnosis not present

## 2017-06-23 DIAGNOSIS — R011 Cardiac murmur, unspecified: Secondary | ICD-10-CM | POA: Diagnosis not present

## 2017-06-23 DIAGNOSIS — E785 Hyperlipidemia, unspecified: Secondary | ICD-10-CM | POA: Diagnosis not present

## 2017-06-23 DIAGNOSIS — M75112 Incomplete rotator cuff tear or rupture of left shoulder, not specified as traumatic: Secondary | ICD-10-CM | POA: Diagnosis present

## 2017-06-23 DIAGNOSIS — Z888 Allergy status to other drugs, medicaments and biological substances status: Secondary | ICD-10-CM | POA: Diagnosis not present

## 2017-06-23 DIAGNOSIS — X58XXXA Exposure to other specified factors, initial encounter: Secondary | ICD-10-CM | POA: Diagnosis not present

## 2017-06-23 DIAGNOSIS — S43432A Superior glenoid labrum lesion of left shoulder, initial encounter: Secondary | ICD-10-CM | POA: Diagnosis not present

## 2017-06-23 HISTORY — DX: Gastro-esophageal reflux disease without esophagitis: K21.9

## 2017-06-23 NOTE — Patient Instructions (Addendum)
Your procedure is scheduled on: 06/26/17 Fri Report to Same Day Surgery 2nd floor medical mall [facility] Entrance-take elevator on left to 2nd floor.  Check in with surgery information desk.) To find out your arrival time please call [phone] between 1PM - 3PM on 06/25/17 Thurs  Remember: Instructions that are not followed completely may result in serious medical risk, up to and including death, or upon the discretion of your surgeon and anesthesiologist your surgery may need to be rescheduled.    _x___ 1. Do not eat food after midnight the night before your procedure. You may drink clear liquids up to 2 hours before you are scheduled to arrive at the hospital for your procedure.  Do not drink clear liquids within 2 hours of your scheduled arrival to the hospital.  Clear liquids include  --Water or Apple juice without pulp  --Clear carbohydrate beverage such as ClearFast or Gatorade  --Black Coffee or Clear Tea (No milk, no creamers, do not add anything to                  the coffee or Tea Type 1 and type 2 diabetics should only drink water.  No gum chewing or hard candies.     __x__ 2. No Alcohol for 24 hours before or after surgery.   __x__3. No Smoking for 24 prior to surgery.   ____  4. Bring all medications with you on the day of surgery if instructed.    __x__ 5. Notify your doctor if there is any change in your medical condition     (cold, fever, infections).     Do not wear jewelry, make-up, hairpins, clips or nail polish.  Do not wear lotions, powders, or perfumes. You may wear deodorant.  Do not shave 48 hours prior to surgery. Men may shave face and neck.  Do not bring valuables to the hospital.    [facility] is not responsible for any belongings or valuables.               Contacts, dentures or bridgework may not be worn into surgery.  Leave your suitcase in the car. After surgery it may be brought to your room.  For patients admitted to the hospital, discharge  time is determined by your                       treatment team.   Patients discharged the day of surgery will not be allowed to drive home.  You will need someone to drive you home and stay with you the night of your procedure.    Please read over the following fact sheets that you were given:   [facility] Preparing for Surgery and or MRSA Information   _x___ Take anti-hypertensive listed below, cardiac, seizure, asthma,     anti-reflux and psychiatric medicines. These include:  1. omeprazole (PRILOSEC) 40 MG capsule  2.tamoxifen (NOLVADEX) 20 MG tablet  3.tolterodine (DETROL LA) 4 MG 24 hr capsule  4.  5.  6.  ____Fleets enema or Magnesium Citrate as directed.   _x___ Use CHG Soap or sage wipes as directed on instruction sheet   ____ Use inhalers on the day of surgery and bring to hospital day of surgery  ____ Stop Metformin and Janumet 2 days prior to surgery.    ____ Take 1/2 of usual insulin dose the night before surgery and none on the morning     surgery.   _x___ Follow  recommendations from Cardiologist, Pulmonologist or PCP regarding          stopping Aspirin, Coumadin, Plavix ,Eliquis, Effient, or Pradaxa, and Pletal. Stopped aspirin on last Thurs.   X____Stop Anti-inflammatories such as Advil, Aleve, Ibuprofen, Motrin, Naproxen, Naprosyn, Goodies powders or aspirin products. OK to take Tylenol and                          Celebrex.   _x___ Stop supplements until after surgery.  But may continue Vitamin D, Vitamin B,       and multivitamin.   ____ Bring C-Pap to the hospital.

## 2017-06-25 MED ORDER — CLINDAMYCIN PHOSPHATE 900 MG/50ML IV SOLN
900.0000 mg | Freq: Once | INTRAVENOUS | Status: AC
  Administered 2017-06-26: 900 mg via INTRAVENOUS

## 2017-06-26 ENCOUNTER — Ambulatory Visit
Admission: RE | Admit: 2017-06-26 | Discharge: 2017-06-26 | Disposition: A | Discharge status: Home / Self Care | Payer: Medicare Other | Source: Ambulatory Visit | Attending: Orthopedic Surgery | Admitting: Orthopedic Surgery

## 2017-06-26 ENCOUNTER — Ambulatory Visit: Payer: Medicare Other | Admitting: Anesthesiology

## 2017-06-26 ENCOUNTER — Encounter
Admission: RE | Disposition: A | Discharge status: Home / Self Care | Payer: Self-pay | Source: Ambulatory Visit | Attending: Orthopedic Surgery

## 2017-06-26 DIAGNOSIS — M7522 Bicipital tendinitis, left shoulder: Secondary | ICD-10-CM | POA: Insufficient documentation

## 2017-06-26 DIAGNOSIS — M7552 Bursitis of left shoulder: Secondary | ICD-10-CM | POA: Insufficient documentation

## 2017-06-26 DIAGNOSIS — M19011 Primary osteoarthritis, right shoulder: Secondary | ICD-10-CM | POA: Insufficient documentation

## 2017-06-26 DIAGNOSIS — X58XXXA Exposure to other specified factors, initial encounter: Secondary | ICD-10-CM | POA: Insufficient documentation

## 2017-06-26 DIAGNOSIS — M7541 Impingement syndrome of right shoulder: Secondary | ICD-10-CM | POA: Insufficient documentation

## 2017-06-26 DIAGNOSIS — M7581 Other shoulder lesions, right shoulder: Secondary | ICD-10-CM | POA: Insufficient documentation

## 2017-06-26 DIAGNOSIS — K219 Gastro-esophageal reflux disease without esophagitis: Secondary | ICD-10-CM | POA: Insufficient documentation

## 2017-06-26 DIAGNOSIS — Z79899 Other long term (current) drug therapy: Secondary | ICD-10-CM | POA: Insufficient documentation

## 2017-06-26 DIAGNOSIS — R011 Cardiac murmur, unspecified: Secondary | ICD-10-CM | POA: Insufficient documentation

## 2017-06-26 DIAGNOSIS — Z888 Allergy status to other drugs, medicaments and biological substances status: Secondary | ICD-10-CM | POA: Insufficient documentation

## 2017-06-26 DIAGNOSIS — Z85828 Personal history of other malignant neoplasm of skin: Secondary | ICD-10-CM | POA: Insufficient documentation

## 2017-06-26 DIAGNOSIS — M8580 Other specified disorders of bone density and structure, unspecified site: Secondary | ICD-10-CM | POA: Insufficient documentation

## 2017-06-26 DIAGNOSIS — M19012 Primary osteoarthritis, left shoulder: Secondary | ICD-10-CM | POA: Insufficient documentation

## 2017-06-26 DIAGNOSIS — M75112 Incomplete rotator cuff tear or rupture of left shoulder, not specified as traumatic: Secondary | ICD-10-CM | POA: Insufficient documentation

## 2017-06-26 DIAGNOSIS — M7542 Impingement syndrome of left shoulder: Secondary | ICD-10-CM | POA: Insufficient documentation

## 2017-06-26 DIAGNOSIS — S43432A Superior glenoid labrum lesion of left shoulder, initial encounter: Secondary | ICD-10-CM | POA: Insufficient documentation

## 2017-06-26 DIAGNOSIS — Z880 Allergy status to penicillin: Secondary | ICD-10-CM | POA: Insufficient documentation

## 2017-06-26 DIAGNOSIS — M65812 Other synovitis and tenosynovitis, left shoulder: Secondary | ICD-10-CM | POA: Insufficient documentation

## 2017-06-26 DIAGNOSIS — E785 Hyperlipidemia, unspecified: Secondary | ICD-10-CM | POA: Insufficient documentation

## 2017-06-26 DIAGNOSIS — Z7982 Long term (current) use of aspirin: Secondary | ICD-10-CM | POA: Insufficient documentation

## 2017-06-26 HISTORY — PX: SHOULDER ARTHROSCOPY WITH OPEN ROTATOR CUFF REPAIR: SHX6092

## 2017-06-26 SURGERY — ARTHROSCOPY, SHOULDER WITH REPAIR, ROTATOR CUFF, OPEN
Anesthesia: Regional | Site: Shoulder | Laterality: Left | Wound class: Clean

## 2017-06-26 MED ORDER — LIDOCAINE HCL (PF) 2 % IJ SOLN
INTRAMUSCULAR | Status: AC
  Filled 2017-06-26: qty 10

## 2017-06-26 MED ORDER — LIDOCAINE-EPINEPHRINE 1 %-1:100000 IJ SOLN
INTRAMUSCULAR | Status: AC
  Filled 2017-06-26: qty 1

## 2017-06-26 MED ORDER — ASPIRIN EC 325 MG PO TBEC: 325.0000 mg | DELAYED_RELEASE_TABLET | Freq: Every day | ORAL | 0 refills | Status: AC

## 2017-06-26 MED ORDER — GLYCOPYRROLATE 0.2 MG/ML IJ SOLN
INTRAMUSCULAR | Status: AC
  Filled 2017-06-26: qty 1

## 2017-06-26 MED ORDER — PROPOFOL 10 MG/ML IV BOLUS
INTRAVENOUS | Status: DC | PRN
  Administered 2017-06-26: 20 mg via INTRAVENOUS
  Administered 2017-06-26: 150 mg via INTRAVENOUS

## 2017-06-26 MED ORDER — EPINEPHRINE 30 MG/30ML IJ SOLN
INTRAMUSCULAR | Status: AC
  Filled 2017-06-26: qty 1

## 2017-06-26 MED ORDER — SUGAMMADEX SODIUM 200 MG/2ML IV SOLN
INTRAVENOUS | Status: DC | PRN
  Administered 2017-06-26: 125 mg via INTRAVENOUS

## 2017-06-26 MED ORDER — SODIUM CHLORIDE 0.9 % IV SOLN
INTRAVENOUS | Status: DC | PRN
  Administered 2017-06-26: 15 ug/min via INTRAVENOUS

## 2017-06-26 MED ORDER — BUPIVACAINE HCL (PF) 0.5 % IJ SOLN
INTRAMUSCULAR | Status: AC
  Filled 2017-06-26: qty 10

## 2017-06-26 MED ORDER — GLYCOPYRROLATE 0.2 MG/ML IJ SOLN
INTRAMUSCULAR | Status: DC | PRN
  Administered 2017-06-26: 0.2 mg via INTRAVENOUS

## 2017-06-26 MED ORDER — BUPIVACAINE HCL (PF) 0.5 % IJ SOLN
INTRAMUSCULAR | Status: DC | PRN
  Administered 2017-06-26: 6 mL via PERINEURAL
  Administered 2017-06-26: 4 mL via PERINEURAL

## 2017-06-26 MED ORDER — ONDANSETRON 4 MG PO TBDP: 4.0000 mg | ORAL_TABLET | Freq: Three times a day (TID) | ORAL | 0 refills | Status: DC | PRN

## 2017-06-26 MED ORDER — EPHEDRINE SULFATE 50 MG/ML IJ SOLN
INTRAMUSCULAR | Status: AC
  Filled 2017-06-26: qty 1

## 2017-06-26 MED ORDER — DEXAMETHASONE SODIUM PHOSPHATE 10 MG/ML IJ SOLN
INTRAMUSCULAR | Status: DC | PRN
  Administered 2017-06-26: 10 mg via INTRAVENOUS

## 2017-06-26 MED ORDER — BUPIVACAINE HCL (PF) 0.5 % IJ SOLN
INTRAMUSCULAR | Status: DC | PRN
  Administered 2017-06-26: 5 mL

## 2017-06-26 MED ORDER — DEXAMETHASONE SODIUM PHOSPHATE 10 MG/ML IJ SOLN
INTRAMUSCULAR | Status: AC
  Filled 2017-06-26: qty 1

## 2017-06-26 MED ORDER — EPINEPHRINE PF 1 MG/ML IJ SOLN
INTRAMUSCULAR | Status: DC | PRN
  Administered 2017-06-26: 19 mL

## 2017-06-26 MED ORDER — ACETAMINOPHEN 500 MG PO TABS: 1000.0000 mg | ORAL_TABLET | Freq: Three times a day (TID) | ORAL | 2 refills | Status: AC

## 2017-06-26 MED ORDER — OXYCODONE HCL 5 MG PO TABS: 5.0000 mg | ORAL_TABLET | ORAL | 0 refills | Status: DC | PRN

## 2017-06-26 MED ORDER — FENTANYL CITRATE (PF) 100 MCG/2ML IJ SOLN
INTRAMUSCULAR | Status: AC
  Filled 2017-06-26: qty 2

## 2017-06-26 MED ORDER — FENTANYL CITRATE (PF) 100 MCG/2ML IJ SOLN
INTRAMUSCULAR | Status: AC
  Administered 2017-06-26: 50 ug via INTRAVENOUS
  Filled 2017-06-26: qty 2

## 2017-06-26 MED ORDER — LACTATED RINGERS IV SOLN
INTRAVENOUS | Status: DC
  Administered 2017-06-26 (×2): via INTRAVENOUS

## 2017-06-26 MED ORDER — LIDOCAINE HCL (PF) 1 % IJ SOLN
INTRAMUSCULAR | Status: AC
  Filled 2017-06-26: qty 5

## 2017-06-26 MED ORDER — EPHEDRINE SULFATE 50 MG/ML IJ SOLN
INTRAMUSCULAR | Status: DC | PRN
  Administered 2017-06-26: 10 mg via INTRAVENOUS

## 2017-06-26 MED ORDER — OXYCODONE HCL 5 MG PO TABS: 5.0000 mg | ORAL_TABLET | Freq: Once | ORAL | Status: DC | PRN

## 2017-06-26 MED ORDER — ONDANSETRON HCL 4 MG/2ML IJ SOLN
INTRAMUSCULAR | Status: DC | PRN
  Administered 2017-06-26: 4 mg via INTRAVENOUS

## 2017-06-26 MED ORDER — LIDOCAINE-EPINEPHRINE 1 %-1:100000 IJ SOLN
INTRAMUSCULAR | Status: DC | PRN
  Administered 2017-06-26: 5 mL

## 2017-06-26 MED ORDER — PROPOFOL 10 MG/ML IV BOLUS
INTRAVENOUS | Status: AC
  Filled 2017-06-26: qty 20

## 2017-06-26 MED ORDER — ROCURONIUM BROMIDE 100 MG/10ML IV SOLN
INTRAVENOUS | Status: DC | PRN
  Administered 2017-06-26: 10 mg via INTRAVENOUS
  Administered 2017-06-26: 20 mg via INTRAVENOUS

## 2017-06-26 MED ORDER — CLINDAMYCIN PHOSPHATE 900 MG/50ML IV SOLN
INTRAVENOUS | Status: AC
  Filled 2017-06-26: qty 50

## 2017-06-26 MED ORDER — FENTANYL CITRATE (PF) 100 MCG/2ML IJ SOLN
INTRAMUSCULAR | Status: DC | PRN
  Administered 2017-06-26 (×4): 50 ug via INTRAVENOUS

## 2017-06-26 MED ORDER — SUCCINYLCHOLINE CHLORIDE 20 MG/ML IJ SOLN
INTRAMUSCULAR | Status: DC | PRN
  Administered 2017-06-26: 100 mg via INTRAVENOUS

## 2017-06-26 MED ORDER — MIDAZOLAM HCL 2 MG/2ML IJ SOLN
INTRAMUSCULAR | Status: AC
  Administered 2017-06-26: 1 mg via INTRAVENOUS
  Filled 2017-06-26: qty 2

## 2017-06-26 MED ORDER — OXYCODONE HCL 5 MG/5ML PO SOLN: 5.0000 mg | Freq: Once | ORAL | Status: DC | PRN

## 2017-06-26 MED ORDER — MIDAZOLAM HCL 2 MG/2ML IJ SOLN
1.0000 mg | Freq: Once | INTRAMUSCULAR | Status: AC
  Administered 2017-06-26: 1 mg via INTRAVENOUS

## 2017-06-26 MED ORDER — BUPIVACAINE HCL (PF) 0.5 % IJ SOLN
INTRAMUSCULAR | Status: AC
  Filled 2017-06-26: qty 30

## 2017-06-26 MED ORDER — BUPIVACAINE LIPOSOME 1.3 % IJ SUSP
INTRAMUSCULAR | Status: AC
  Filled 2017-06-26: qty 20

## 2017-06-26 MED ORDER — FENTANYL CITRATE (PF) 100 MCG/2ML IJ SOLN: 25.0000 ug | INTRAMUSCULAR | Status: DC | PRN

## 2017-06-26 MED ORDER — BUPIVACAINE LIPOSOME 1.3 % IJ SUSP
INTRAMUSCULAR | Status: DC | PRN
  Administered 2017-06-26: 14 mL via PERINEURAL
  Administered 2017-06-26: 6 mL via PERINEURAL

## 2017-06-26 MED ORDER — LIDOCAINE HCL (CARDIAC) 20 MG/ML IV SOLN
INTRAVENOUS | Status: DC | PRN
  Administered 2017-06-26: 50 mg via INTRAVENOUS

## 2017-06-26 MED ORDER — ONDANSETRON HCL 4 MG/2ML IJ SOLN
INTRAMUSCULAR | Status: AC
  Filled 2017-06-26: qty 2

## 2017-06-26 MED ORDER — FENTANYL CITRATE (PF) 100 MCG/2ML IJ SOLN
50.0000 ug | Freq: Once | INTRAMUSCULAR | Status: AC
  Administered 2017-06-26: 50 ug via INTRAVENOUS

## 2017-06-26 MED ORDER — ROCURONIUM BROMIDE 50 MG/5ML IV SOLN
INTRAVENOUS | Status: AC
  Filled 2017-06-26: qty 1

## 2017-06-26 SURGICAL SUPPLY — 88 items
ADAPTER IRRIG TUBE 2 SPIKE SOL (ADAPTER) ×6 IMPLANT
ANCHOR TENDON REGENETEN (Staple) ×6 IMPLANT
ANCHORS BONE REGENETEN (Anchor) ×3 IMPLANT
BLADE OSCILLATING/SAGITTAL (BLADE)
BLADE SW THK.38XMED LNG THN (BLADE) IMPLANT
BUR BR 5.5 12 FLUTE (BURR) ×3 IMPLANT
BUR RADIUS 4.0X18.5 (BURR) ×3 IMPLANT
BUR RADIUS 5.5 (BURR) IMPLANT
CANNULA 5.75X7 CRYSTAL CLEAR (CANNULA) ×6 IMPLANT
CANNULA PARTIAL THREAD 2X7 (CANNULA) IMPLANT
CANNULA PASSPORT BUTTON 12 (MISCELLANEOUS) ×2 IMPLANT
CANNULA PASSPORT BUTTON 12MM (MISCELLANEOUS) ×1
CANNULA TWIST IN 8.25X9CM (CANNULA) IMPLANT
CHLORAPREP W/TINT 26ML (MISCELLANEOUS) ×3 IMPLANT
CLOSURE WOUND 1/2 X4 (GAUZE/BANDAGES/DRESSINGS) ×1
COOLER POLAR GLACIER W/PUMP (MISCELLANEOUS) ×3 IMPLANT
COVER LIGHT HANDLE STERIS (MISCELLANEOUS) ×3 IMPLANT
CRADLE LAMINECT ARM (MISCELLANEOUS) ×3 IMPLANT
DERMABOND ADVANCED (GAUZE/BANDAGES/DRESSINGS) ×2
DERMABOND ADVANCED .7 DNX12 (GAUZE/BANDAGES/DRESSINGS) ×1 IMPLANT
DRAPE IMP U-DRAPE 54X76 (DRAPES) ×6 IMPLANT
DRAPE INCISE IOBAN 66X45 STRL (DRAPES) ×3 IMPLANT
DRAPE SHEET LG 3/4 BI-LAMINATE (DRAPES) ×3 IMPLANT
DRAPE STERI 35X30 U-POUCH (DRAPES) ×3 IMPLANT
DRAPE U-SHAPE 47X51 STRL (DRAPES) ×6 IMPLANT
DRSG TEGADERM 4X4.75 (GAUZE/BANDAGES/DRESSINGS) ×9 IMPLANT
ELECT REM PT RETURN 9FT ADLT (ELECTROSURGICAL) ×3
ELECTRODE REM PT RTRN 9FT ADLT (ELECTROSURGICAL) ×1 IMPLANT
GAUZE PETRO XEROFOAM 1X8 (MISCELLANEOUS) ×3 IMPLANT
GAUZE SPONGE 4X4 12PLY STRL (GAUZE/BANDAGES/DRESSINGS) ×3 IMPLANT
GAUZE SPONGE NON-WVN 2X2 STRL (MISCELLANEOUS) ×4 IMPLANT
GLOVE BIOGEL PI IND STRL 8 (GLOVE) ×1 IMPLANT
GLOVE BIOGEL PI INDICATOR 8 (GLOVE) ×2
GLOVE SURG SYN 7.5  E (GLOVE) ×2
GLOVE SURG SYN 7.5 E (GLOVE) ×1 IMPLANT
GOWN STRL REUS W/ TWL LRG LVL3 (GOWN DISPOSABLE) ×1 IMPLANT
GOWN STRL REUS W/TWL LRG LVL3 (GOWN DISPOSABLE) ×2
GOWN STRL REUS W/TWL LRG LVL4 (GOWN DISPOSABLE) ×3 IMPLANT
IMPLANT REGENETEN MEDIUM (Shoulder) ×3 IMPLANT
IV LACTATED RINGER IRRG 3000ML (IV SOLUTION) ×38
IV LR IRRIG 3000ML ARTHROMATIC (IV SOLUTION) ×19 IMPLANT
KIT STABILIZATION SHOULDER (MISCELLANEOUS) ×3 IMPLANT
KIT SUTURETAK 3.0 INSERT PERC (KITS) IMPLANT
KIT TURNOVER KIT A (KITS) ×3 IMPLANT
MANIFOLD NEPTUNE II (INSTRUMENTS) ×3 IMPLANT
MASK FACE SPIDER DISP (MASK) ×3 IMPLANT
MAT ABSORB  FLUID 56X50 GRAY (MISCELLANEOUS) ×4
MAT ABSORB FLUID 56X50 GRAY (MISCELLANEOUS) ×2 IMPLANT
NDL SAFETY ECLIPSE 18X1.5 (NEEDLE) ×1 IMPLANT
NEEDLE HYPO 18GX1.5 SHARP (NEEDLE) ×2
NEEDLE HYPO 22GX1.5 SAFETY (NEEDLE) ×3 IMPLANT
NEEDLE MAYO 6 CRC TAPER PT (NEEDLE) IMPLANT
PACK ARTHROSCOPY SHOULDER (MISCELLANEOUS) ×3 IMPLANT
PAD ABD DERMACEA PRESS 5X9 (GAUZE/BANDAGES/DRESSINGS) ×3 IMPLANT
PAD WRAPON POLAR SHDR XLG (MISCELLANEOUS) ×1 IMPLANT
SET TUBE SUCT SHAVER OUTFL 24K (TUBING) ×3 IMPLANT
SET TUBE TIP INTRA-ARTICULAR (MISCELLANEOUS) ×3 IMPLANT
SLING ULTRA II M (MISCELLANEOUS) ×3 IMPLANT
SPONGE VERSALON 2X2 STRL (MISCELLANEOUS) ×8
STAPLER SKIN PROX 35W (STAPLE) IMPLANT
STRAP SAFETY 5IN WIDE (MISCELLANEOUS) ×3 IMPLANT
STRIP CLOSURE SKIN 1/2X4 (GAUZE/BANDAGES/DRESSINGS) ×2 IMPLANT
SUT ETHILON 3-0 (SUTURE) IMPLANT
SUT ETHILON 4-0 (SUTURE)
SUT ETHILON 4-0 FS2 18XMFL BLK (SUTURE)
SUT LASSO 90 DEG SD STR (SUTURE) IMPLANT
SUT MNCRL 4-0 (SUTURE)
SUT MNCRL 4-0 27XMFL (SUTURE)
SUT PDS AB 0 CT1 27 (SUTURE) ×15 IMPLANT
SUT PDS PLUS 0 (SUTURE) ×2
SUT PDS PLUS AB 0 CT-2 (SUTURE) ×1 IMPLANT
SUT PROLENE 0 CT 1 30 (SUTURE) ×3 IMPLANT
SUT PROLENE 0 CT 2 (SUTURE) ×3 IMPLANT
SUT PROLENE 6 0 P 1 18 (SUTURE) IMPLANT
SUT TICRON 2-0 30IN 311381 (SUTURE) IMPLANT
SUT VIC AB 0 CT1 36 (SUTURE) IMPLANT
SUT VIC AB 2-0 CT2 27 (SUTURE) IMPLANT
SUT VICRYL 3-0 27IN (SUTURE) IMPLANT
SUTURE ETHLN 4-0 FS2 18XMF BLK (SUTURE) IMPLANT
SUTURE MNCRL 4-0 27XMF (SUTURE) IMPLANT
SYR 10ML LL (SYRINGE) ×3 IMPLANT
TAPE CLOTH 3X10 WHT NS LF (GAUZE/BANDAGES/DRESSINGS) ×3 IMPLANT
TAPE MICROFOAM 4IN (TAPE) ×3 IMPLANT
TUBING ARTHRO INFLOW-ONLY STRL (TUBING) ×3 IMPLANT
TUBING CONNECTING 10 (TUBING) ×2 IMPLANT
TUBING CONNECTING 10' (TUBING) ×1
WAND HAND CNTRL MULTIVAC 90 (MISCELLANEOUS) IMPLANT
WRAPON POLAR PAD SHDR XLG (MISCELLANEOUS) ×3

## 2017-06-26 NOTE — Anesthesia Preprocedure Evaluation (Signed)
Anesthesia Evaluation  Patient identified by MRN, date of birth, ID band Patient awake    Reviewed: Allergy & Precautions, H&P , NPO status , Patient's Chart, lab work & pertinent test results  History of Anesthesia Complications Negative for: history of anesthetic complications  Airway Mallampati: III  TM Distance: <3 FB Neck ROM: limited    Dental  (+) Chipped   Pulmonary neg pulmonary ROS, neg shortness of breath,           Cardiovascular Exercise Tolerance: Good (-) angina(-) Past MI and (-) DOE + Valvular Problems/Murmurs      Neuro/Psych negative neurological ROS  negative psych ROS   GI/Hepatic Neg liver ROS, GERD  Medicated and Controlled,  Endo/Other  negative endocrine ROS  Renal/GU      Musculoskeletal  (+) Arthritis ,   Abdominal   Peds  Hematology negative hematology ROS (+)   Anesthesia Other Findings Past Medical History: 02/12/2015: Allergic rhinitis No date: Anemia No date: Arthritis     Comment:  osteoarthritis 02/12/2015: Arthritis, degenerative 10/25/2012: Basal cell carcinoma of cheek     Comment:  Overview:  Basal cell carcinoma of left medial cheek               treated with Mohs surgery on 10/25/2012.  01/28/2012: Basal cell carcinoma of face     Comment:  Overview:  BCC of left medial cheek treated with Mohs               surgery 01-28-12  02/12/2015: CA skin, basal cell 02/12/2015: Climacteric No date: DDD (degenerative disc disease), cervical 02/12/2015: Edema, peripheral 02/12/2015: Female genuine stress incontinence No date: GERD (gastroesophageal reflux disease) No date: Heart murmur 02/12/2015: HLD (hyperlipidemia) No date: Hyperlipidemia 02/12/2015: Injury of eyeball     Comment:  Overview:  LEFT RETINAL TRAUMA  No date: Inverted nipple     Comment:  left nipple inverted, nothing new 02/12/2015: Osteopenia No date: Skin cancer, basal cell 02/12/2015: Stasis, venous No date: Wears  contact lenses  Past Surgical History: 1982: ABDOMINAL HYSTERECTOMY No date: APPENDECTOMY 11/23/2014: BREAST BIOPSY; Left     Comment:  Procedure: BREAST BIOPSY WITH NEEDLE LOCALIZATION;                Surgeon: Leonie Green, MD;  Location: [facility] ORS;                Service: General;  Laterality: Left; 09/25/2014: BREAST BIOPSY; Left     Comment:  complex sclerosing lesion 01/13/2017: BREAST BIOPSY; Right     Comment:  2 area/path pending 02/20/2017: BREAST EXCISIONAL BIOPSY; Right     Comment:  path pending 11/23/2014: BREAST LUMPECTOMY; Left     Comment:  complex sclerosing lesion excised. 02/20/2017: BREAST LUMPECTOMY WITH NEEDLE LOCALIZATION; Right     Comment:  Procedure: EXCISION OF RIGHT BREAST MASS WITH NEEDLE               LOCALIZATION;  Surgeon: Leonie Green, MD;                Location: [facility] ORS;  Service: General;  Laterality:               Right; No date: CHOLECYSTECTOMY No date: DILATION AND CURETTAGE OF UTERUS 06/12/2017: ESOPHAGOGASTRODUODENOSCOPY (EGD) WITH PROPOFOL; N/A     Comment:  Procedure: ESOPHAGOGASTRODUODENOSCOPY (EGD) WITH               PROPOFOL;  Surgeon: Manya Silvas, MD;  Location:  [place] [facility];  Service: Endoscopy;  Laterality: N/A; 1982: Partial hystrectomy 2017: WHIPPLE PROCEDURE     Comment:  thought pt had a slow growing pancreatic mass but it               came back benign     Reproductive/Obstetrics negative OB ROS                             Anesthesia Physical Anesthesia Plan  ASA: II  Anesthesia Plan: General ETT   Post-op Pain Management: GA combined w/ Regional for post-op pain   Induction: Intravenous  PONV Risk Score and Plan: Ondansetron, Dexamethasone, Midazolam and Treatment may vary due to age or medical condition  Airway Management Planned: Oral ETT  Additional Equipment:   Intra-op Plan:   Post-operative Plan: Extubation in OR  Informed Consent: I have  reviewed the patients History and Physical, chart, labs and discussed the procedure including the risks, benefits and alternatives for the proposed anesthesia with the patient or authorized representative who has indicated his/her understanding and acceptance.   Dental Advisory Given  Plan Discussed with: Anesthesiologist, CRNA and Surgeon  Anesthesia Plan Comments: (Patient consented for risks of anesthesia including but not limited to:  - adverse reactions to medications - damage to teeth, lips or other oral mucosa - sore throat or hoarseness - Damage to heart, brain, lungs or loss of life  Patient voiced understanding.)        Anesthesia Quick Evaluation

## 2017-06-26 NOTE — OR Nursing (Signed)
Patient advises physical therapy has already been scheduled to start next week.

## 2017-06-26 NOTE — Anesthesia Procedure Notes (Signed)
Procedure Name: Intubation Performed by: Borden Thune, CRNA Pre-anesthesia Checklist: Patient identified, Patient being monitored, Timeout performed, Emergency Drugs available and Suction available Patient Re-evaluated:Patient Re-evaluated prior to induction Oxygen Delivery Method: Circle system utilized Preoxygenation: Pre-oxygenation with 100% oxygen Induction Type: IV induction Ventilation: Mask ventilation without difficulty Laryngoscope Size: Miller and 2 Grade View: Grade I Tube type: Oral Tube size: 7.0 mm Number of attempts: 1 Airway Equipment and Method: Stylet Placement Confirmation: ETT inserted through vocal cords under direct vision,  positive ETCO2 and breath sounds checked- equal and bilateral Secured at: 21 cm Tube secured with: Tape Dental Injury: Teeth and Oropharynx as per pre-operative assessment        

## 2017-06-26 NOTE — Discharge Instructions (Addendum)
Post-Op Instructions - Regeneten Patch  1. Bracing: You will wear a shoulder immobilizer or sling for 1 week.   2. Driving: No driving for at least 2 weeks post-op.   3. Activity: Progress to motion as tolerated, moving from passive to active-assisted to active motion. For the first 4 weeks, forward flexion is limited to 100. External rotation with the arm by the side is allowed, but abduction-external rotation is not allowed for the first 6 weeks. After 6 weeks, no restrictions on motion or arm use. Return to normal activities normally takes 4-6 months on average. If rehab goes very well, may be able to do most activities at 4 months, except overhead or contact sports.  4. Physical Therapy: Begins 3-4 days after surgery  5. Medications:  - You will be provided a prescription for narcotic pain medicine. After surgery, take 1-2 narcotic tablets every 4 hours if needed for severe pain.  - A prescription for anti-nausea medication will be provided in case the narcotic medicine causes nausea - take 1 tablet every 6 hours only if nauseated.   - Take tylenol 1000 mg (2 Extra Strength tablets or 3 regular strength) every 8 hours for pain.  May decrease or stop tylenol 5 days after surgery if you are having minimal pain. - Take ASA 325mg /day x 2 weeks to help prevent DVTs/PEs (blood clots).  - DO NOT take ANY nonsteroidal anti-inflammatory pain medications (Advil, Motrin, Ibuprofen, Aleve, Naproxen, or Naprosyn). These medicines can inhibit healing of your shoulder repair.    If you are taking prescription medication for anxiety, depression, insomnia, muscle spasm, chronic pain, or for attention deficit disorder, you are advised that you are at a higher risk of adverse effects with use of narcotics post-op, including narcotic addiction/dependence, depressed breathing, death. If you use non-prescribed substances: alcohol, marijuana, cocaine, heroin, methamphetamines, etc., you are at a higher risk of  adverse effects with use of narcotics post-op, including narcotic addiction/dependence, depressed breathing, death. You are advised that taking > 50 morphine milligram equivalents (MME) of narcotic pain medication per day results in twice the risk of overdose or death. For your prescription provided: oxycodone 5 mg - taking more than 6 tablets per day would result in > 50 morphine milligram equivalents (MME) of narcotic pain medication. Be advised that we will prescribe narcotics short-term, for acute post-operative pain only - 3 weeks for major operations such as shoulder repair/reconstruction surgeries.     6. Post-Op Appointment:  Your first post-op appointment will be 10-14 days post-op.  7. Work or School: For most, but not all procedures, we advise staying out of work or school for at least 1 to 2 weeks in order to recover from the stress of surgery and to allow time for healing.   If you need a work or school note this can be provided.   8. Smoking: If you are a smoker, you need to refrain from smoking in the postoperative period. The nicotine in cigarettes will inhibit healing of your shoulder repair and decrease the chance of successful repair. Similarly, nicotine containing products (gum, patches) should be avoided.   Post-operative Brace: Apply and remove the brace you received as you were instructed to at the time of fitting and as described in detail as the braces instructions for use indicate.  Wear the brace for the period of time prescribed by your physician.  The brace can be cleaned with soap and water and allowed to air dry only.  Should the brace  result in increased pain, decreased feeling (numbness/tingling), increased swelling or an overall worsening of your medical condition, please contact your doctor immediately.  If an emergency situation occurs as a result of wearing the brace after normal business hours, please dial 911 and seek immediate medical attention.  Let your  doctor know if you have any further questions about the brace issued to you. Refer to the shoulder sling instructions for use if you have any questions regarding the correct fit of your shoulder sling.  [place] for Troubleshooting: [phone]  Video that illustrates how to properly use a shoulder sling: "Instructions for Proper Use of an Orthopaedic Sling" [email or web address]          Interscalene Nerve Block with Exparel  1.  For your surgery you have received an Interscalene Nerve Block with Exparel. 2. Nerve Blocks affect many types of nerves, including nerves that control movement, pain and normal sensation.  You may experience feelings such as numbness, tingling, heaviness, weakness or the inability to move your arm or the feeling or sensation that your arm has "fallen asleep". 3. A nerve block with Exparel can last up to 5 days.  Usually the weakness wears off first.  The tingling and heaviness usually wear off next.  Finally you may start to notice pain.  Keep in mind that this may occur in any order.  Once a nerve block starts to wear off it is usually completely gone within 60 minutes. 4. ISNB may cause mild shortness of breath, a hoarse voice, blurry vision, unequal pupils, or drooping of the face on the same side as the nerve block.  These symptoms will usually resolve with the numbness.  Very rarely the procedure itself can cause mild seizures. 5. If needed, your surgeon will give you a prescription for pain medication.  It will take about 60 minutes for the oral pain medication to become fully effective.  So, it is recommended that you start taking this medication before the nerve block first begins to wear off, or when you first begin to feel discomfort. 6. Take your pain medication only as prescribed.  Pain medication can cause sedation and decrease your breathing if you take more than you need for the level of pain that you have. 7. Nausea is  a common side effect of many pain medications.  You may want to eat something before taking your pain medicine to prevent nausea. 8. After an Interscalene nerve block, you cannot feel pain, pressure or extremes in temperature in the effected arm.  Because your arm is numb it is at an increased risk for injury.  To decrease the possibility of injury, please practice the following:  a. While you are awake change the position of your arm frequently to prevent too much pressure on any one area for prolonged periods of time. b.  If you have a cast or tight dressing, check the color or your fingers every couple of hours.  Call your surgeon with the appearance of any discoloration (white or blue). c. If you are given a sling to wear before you go home, please wear it  at all times until the block has completely worn off.  Do not get up at night without your sling. d. Please contact [facility] or your surgeon if you do not begin to regain sensation after 7 days from the surgery.  Anesthesia may be contacted by calling the Same Day Surgery Department, Mon. through Fri., 6 am to 4 pm at  [phone].   e. If you experience any other problems or concerns, please contact your surgeon's office. f. If you experience severe or prolonged shortness of breath go to the nearest emergency department.    AMBULATORY SURGERY  DISCHARGE INSTRUCTIONS   1) The drugs that you were given will stay in your system until tomorrow so for the next 24 hours you should not:  A) Drive an automobile B) Make any legal decisions C) Drink any alcoholic beverage   2) You may resume regular meals tomorrow.  Today it is better to start with liquids and gradually work up to solid foods.  You may eat anything you prefer, but it is better to start with liquids, then soup and crackers, and gradually work up to solid foods.   3) Please notify your doctor immediately if you have any unusual bleeding, trouble breathing, redness and  pain at the surgery site, drainage, fever, or pain not relieved by medication.    4) Additional Instructions:        Please contact your physician with any problems or Same Day Surgery at [phone], Monday through Friday 6 am to 4 pm, or [place] at [facility] number at [phone].

## 2017-06-26 NOTE — Transfer of Care (Signed)
Immediate Anesthesia Transfer of Care Note  Patient: Tabitha Rice  Procedure(s) Performed: SHOULDER ARTHROSCOPY WITH OPEN ROTATOR CUFF REPAIR (Left Shoulder)  Patient Location: PACU  Anesthesia Type:GA combined with regional for post-op pain  Level of Consciousness: sedated  Airway & Oxygen Therapy: Patient Spontanous Breathing and Patient connected to face mask oxygen  Post-op Assessment: Report given to RN and Post -op Vital signs reviewed and stable  Post vital signs: Reviewed  Last Vitals:  Vitals:   06/26/17 1034 06/26/17 1454  BP: (!) 153/79 (!) 123/58  Pulse: (!) 59 86  Resp: 16 17  Temp:    SpO2: 99% 100%    Last Pain:  Vitals:   06/26/17 0854  TempSrc: Oral         Complications: No apparent anesthesia complications

## 2017-06-26 NOTE — Anesthesia Procedure Notes (Signed)
Anesthesia Regional Block: Interscalene brachial plexus block   Pre-Anesthetic Checklist: ,, timeout performed, Correct Patient, Correct Site, Correct Laterality, Correct Procedure, Correct Position, site marked, Risks and benefits discussed,  Surgical consent,  Pre-op evaluation,  At surgeon's request and post-op pain management  Laterality: Upper and Left  Prep: chloraprep       Needles:  Injection technique: Single-shot  Needle Type: Stimiplex     Needle Length: 5cm  Needle Gauge: 22     Additional Needles:   Narrative:  Start time: 06/26/2017 10:05 AM End time: 06/26/2017 10:09 AM Injection made incrementally with aspirations every 5 mL.  Performed by: Personally  Anesthesiologist: Shanteria Laye, Precious Haws, MD  Additional Notes: Functioning IV was confirmed and monitors were applied.  A 76mm 22ga Stimuplex needle was used. Sterile prep,hand hygiene and sterile gloves were used.  Minimal sedation used for procedure.  No paresthesia endorsed by patient during the procedure.  Negative aspiration and negative test dose prior to incremental administration of local anesthetic. The patient tolerated the procedure well with no immediate complications.

## 2017-06-26 NOTE — Op Note (Signed)
SURGERY DATE: @TODAY @  PRE-OP DIAGNOSIS:  1. Left subacromial impingement 2. Left biceps tendinopathy 3. Left partial thickness rotator cuff tear  POST-OP DIAGNOSIS: 1. Left subacromial impingement 2. Left biceps tendinopathy 3. Left partial thickness rotator cuff tear  PROCEDURES:  1. Left arthroscopic rotator cuff repair with Regeneten patch 2. Left biceps tenotomy 3. Left extensive debridement of shoulder (glenohumeral and subacromial spaces) 5. Left subacromial decompression  SURGEON: Cato Mulligan, MD  ANESTHESIA: Gen with interscalene block using exparil  ESTIMATED BLOOD LOSS: minimal  DRAINS:  none  TOTAL IV FLUIDS: per anesthesia   SPECIMENS: none  IMPLANTS:  - [place] patch with associated tendon staples  OPERATIVE FINDINGS:  Examination under anesthesia: A careful examination under anesthesia was performed.  Passive range of motion was: FF: 160; ER at side: 45; ER in abduction: NT; IR in abduction: NT.  Anterior load shift: NT.  Posterior load shift: NT.  Sulcus in neutral: NT.  Sulcus in ER: NT.    Intra-operative findings: A thorough arthroscopic examination of the shoulder was performed.  The findings are: 1. Biceps tendon: split tear near the biceps anchor and tendinopathy with significant erythema 2. Superior labrum: Type 2 SLAP tear 3. Posterior labrum and capsule: normal 4. Inferior capsule and inferior recess: normal 5. Glenoid cartilage surface: Grade 1-2 degenerative changes near mid-portion of glenoid 6. Supraspinatus attachment: significant fraying of the underside of the supraspinatus with partial articular sided tear and partial thickness articular sided tear medial to the supraspinatus insertion 7. Posterior rotator cuff attachment: normal 8. Humeral head articular cartilage: normal 9. Rotator interval: normal 10: Subscapularis tendon: normal 11. Anterior labrum: degenerative 12. IGHL: normal  OPERATIVE REPORT:    Indications for procedure: Tabitha Rice is a 72 y.o. year old female with chronic L shoulder pain that has failed nonoperative management including rest, activity modification, physical therapy, and corticosteroid injection. MRI showed a partial thickness rotator cuff tear. After discussion of risks, benefits, and alternatives to surgery, the patient elected to proceed with above mentioned procedure. The patient understands that use of the Regeneten patch is relatively new and long-term data is unknown.  Procedure in detail:  I identified Tabitha Rice in the pre-operative holding area.  I marked the operative shoulder with my initials. I reviewed the risks and benefits of the proposed surgical intervention, and the patient (and/or patient's guardian) wished to proceed.  Anesthesia was then performed with an interscalene block and Exparil.  The patient was transferred to the operative suite and placed in the beach chair position.    SCDs were placed on the lower extremities. Appropriate IV antibiotics were administered within 1 hour before incision. The operative upper extremity was then prepped and draped in standard fashion. A time out was performed confirming the correct extremity, correct patient and correct procedure.   I then created a standard posterior portal with an 11 blade. The glenohumeral joint was easily entered with a blunt trochar and the arthroscope introduced. The findings of diagnostic arthroscopy are described above. I debrided degenerative tissue including labrum and cartilaginous surfaces and also coagulated the inflamed synovium to obtain hemostasis and reduce the risk of post-operative swelling using an Arthrocare radiofrequency device. Three spinal needles were placed to mark just posterior to the biceps tendon, the medial and anterior extent of the supraspinatus tear, and the posterior and lateral extent of the tear. 0-PDS suture was passed through them and pulled out of the  anterior cannula.  I performed a  biceps tenotomy using an arthroscopic scissor and used a motorized shaver to debride the stump back to a stable base.   Next, the arthroscope was then introduced into the subacromial space. A direct lateral portal was created with an 11-blade after spinal needle localization. An extensive subacromial bursectomy was performed using a combination of the shaver and Arthrocare wand. The entire acromial undersurface was exposed and the CA ligament was subperiosteally elevated to expose the anterior acromial hook. A burr was used to create a flat anterior and lateral aspect of the acromion, converting it from a Type 2 to a Type 1 acromion. Care was made to keep the deltoid fascia intact.  Then, I examined the supraspinatus tendon. There was no region of full-thickness tear. We decided to proceed with Regeneten patch placement. Anterior and posterior cannulas were placed just lateral to the acromion. The Regeneten patch delivery gun was placed into the lateral portal and patch was delivered over the supraspinatus tendon and positioned appropriately in relation to the previously passed 0-PDS sutures. Tendon staples were placed medially, anteriorly, posteriorly, and laterally.The patch was then probed to confirm appropriate stability.   Fluid was evacuated from the shoulder, and the portals were closed with 3-0 Nylon. Xeroform was applied to the portals. A sterile dressing was applied, followed by a Polar Care sleeve and a SlingShot shoulder immobilizer/sling. The patient awoke from anesthesia without difficulty and was transferred to the PACU in stable condition.     COMPLICATIONS: none  DISPOSITION: plan for discharge home after recovery in PACU  POSTOPERATIVE PLAN: Remain in sling (except hygiene and elbow/wrist/hand RoM exercises as instructed by PT) x 1 weeks or when able to wean if sooner. PT to begin 3-4 days after surgery. Use Regeneten patch protocol.

## 2017-06-26 NOTE — Anesthesia Post-op Follow-up Note (Signed)
Anesthesia QCDR form completed.        

## 2017-06-26 NOTE — H&P (Signed)
Paper H&P to be scanned into permanent record. H&P reviewed. No significant changes noted.  

## 2017-06-27 NOTE — Anesthesia Postprocedure Evaluation (Signed)
Anesthesia Post Note  Patient: Tabitha Rice  Procedure(s) Performed: SHOULDER ARTHROSCOPY WITH OPEN ROTATOR CUFF REPAIR (Left Shoulder)  Patient location during evaluation: PACU Anesthesia Type: Regional Level of consciousness: awake and alert Pain management: pain level controlled Vital Signs Assessment: post-procedure vital signs reviewed and stable Respiratory status: spontaneous breathing, nonlabored ventilation, respiratory function stable and patient connected to nasal cannula oxygen Cardiovascular status: blood pressure returned to baseline and stable Postop Assessment: no apparent nausea or vomiting Anesthetic complications: no     Last Vitals:  Vitals:   06/26/17 1543 06/26/17 1625  BP: (!) 131/58 (!) 132/57  Pulse: 82 78  Resp: 16 16  Temp: (!) 36.3 C 36.6 C  SpO2: 97% 99%    Last Pain:  Vitals:   06/26/17 1625  TempSrc: Temporal  PainSc:                  Precious Haws Thierno Hun

## 2017-06-29 ENCOUNTER — Encounter: Payer: Self-pay | Admitting: Orthopedic Surgery

## 2017-07-06 ENCOUNTER — Other Ambulatory Visit: Payer: Medicare Other

## 2017-07-08 ENCOUNTER — Ambulatory Visit: Payer: Medicare Other

## 2017-07-08 ENCOUNTER — Ambulatory Visit: Payer: Medicare Other | Admitting: Hematology and Oncology

## 2017-07-13 ENCOUNTER — Inpatient Hospital Stay: Payer: Medicare Other | Attending: Hematology and Oncology

## 2017-07-13 DIAGNOSIS — Z79810 Long term (current) use of selective estrogen receptor modulators (SERMs): Secondary | ICD-10-CM | POA: Insufficient documentation

## 2017-07-13 DIAGNOSIS — R700 Elevated erythrocyte sedimentation rate: Secondary | ICD-10-CM | POA: Diagnosis not present

## 2017-07-13 DIAGNOSIS — N6091 Unspecified benign mammary dysplasia of right breast: Secondary | ICD-10-CM

## 2017-07-13 DIAGNOSIS — D509 Iron deficiency anemia, unspecified: Secondary | ICD-10-CM | POA: Diagnosis present

## 2017-07-13 DIAGNOSIS — Z79899 Other long term (current) drug therapy: Secondary | ICD-10-CM | POA: Diagnosis not present

## 2017-07-13 LAB — CBC WITH DIFFERENTIAL/PLATELET
Basophils Absolute: 0.1 10*3/uL (ref 0–0.1)
Basophils Relative: 1 %
Eosinophils Absolute: 0.3 10*3/uL (ref 0–0.7)
Eosinophils Relative: 3 %
HCT: 37.9 % (ref 35.0–47.0)
Hemoglobin: 12.6 g/dL (ref 12.0–16.0)
Lymphocytes Relative: 13 %
Lymphs Abs: 1.2 10*3/uL (ref 1.0–3.6)
MCH: 27.1 pg (ref 26.0–34.0)
MCHC: 33.2 g/dL (ref 32.0–36.0)
MCV: 81.4 fL (ref 80.0–100.0)
Monocytes Absolute: 0.4 10*3/uL (ref 0.2–0.9)
Monocytes Relative: 4 %
Neutro Abs: 7.5 10*3/uL — ABNORMAL HIGH (ref 1.4–6.5)
Neutrophils Relative %: 79 %
Platelets: 315 10*3/uL (ref 150–440)
RBC: 4.66 MIL/uL (ref 3.80–5.20)
RDW: 17.1 % — ABNORMAL HIGH (ref 11.5–14.5)
WBC: 9.5 10*3/uL (ref 3.6–11.0)

## 2017-07-13 LAB — IRON AND TIBC
Iron: 43 ug/dL (ref 28–170)
Saturation Ratios: 14 % (ref 10.4–31.8)
TIBC: 303 ug/dL (ref 250–450)
UIBC: 260 ug/dL

## 2017-07-13 LAB — SEDIMENTATION RATE: Sed Rate: 20 mm/hr (ref 0–30)

## 2017-07-13 LAB — FERRITIN: Ferritin: 162 ng/mL (ref 11–307)

## 2017-07-14 NOTE — Progress Notes (Signed)
[facility] day:  07/15/2017   Chief Complaint: Tabitha Rice is a 72 y.o. female with atypical ductal hyperplasia and iron deficiency anemia who is seen for 1 month assessment.  HPI: Patient was last seen in the medical oncology clinic on 06/03/2017.  At that time, she was feeling better after her hospitalization for symptomatic anemia.  She had been transfused with 2 units of PRBCs.  She was awaiting surgery for rotator cuff tendinopathy.  Hemoglobin was 9.7 with hematocrit 28.9.  Ferritin was 38 (low) and possibly falsely elevated secondary to elevated sed rate.   Iron saturation was 4%.  She received Venofer weekly x 3 (06/03/2017 - 06/17/2017).    EGD on 06/12/2017 by Dr. Vira Agar revealed a normal esophagus and post-surgical deformity in the gastric antrum. She has a colonoscopy scheduled in April.   She underwent left arthroscopic rotator cuff repair, left biceps tenotomy, left extensive debridement of shoulder (glenohumeral and subacromial spaces), and left subacromial decompression on 06/26/2017 by Dr. Leim Fabry.  Blood loss was minimal.  CBC on 07/13/2017 revealed a hematocrit of 37.9, hemoglobin 12.6, and MCV was 81.4.  Ferritin was 162.  Iron saturation was 14%.  Sed rate was 20.  During the interim, patient is doing well approximately 3 weeks s/p shoulder surgery. She is participating in physical therapy at [facility]. Patient's range of motion continues to improve. She is unable to abduct the shoulder fully above her head.   Patient denies other acute physical concerns. She has no breast issues. She continues on her tamoxifen as prescribed. Patient denies B symptoms and interval infections. Patient is eating well. Her weight is up 1 pound. Patient denies pain in the clinic today.    Past Medical History:  Diagnosis Date  . Allergic rhinitis 02/12/2015  . Anemia   . Arthritis    osteoarthritis  . Arthritis, degenerative  02/12/2015  . Basal cell carcinoma of cheek 10/25/2012   Overview:  Basal cell carcinoma of left medial cheek treated with Mohs surgery on 10/25/2012.   Marland Kitchen Basal cell carcinoma of face 01/28/2012   Overview:  BCC of left medial cheek treated with Mohs surgery 01-28-12   . CA skin, basal cell 02/12/2015  . Climacteric 02/12/2015  . DDD (degenerative disc disease), cervical   . Edema, peripheral 02/12/2015  . Female genuine stress incontinence 02/12/2015  . GERD (gastroesophageal reflux disease)   . Heart murmur   . HLD (hyperlipidemia) 02/12/2015  . Hyperlipidemia   . Injury of eyeball 02/12/2015   Overview:  LEFT RETINAL TRAUMA   . Inverted nipple    left nipple inverted, nothing new  . Osteopenia 02/12/2015  . Skin cancer, basal cell   . Stasis, venous 02/12/2015  . Wears contact lenses     Past Surgical History:  Procedure Laterality Date  . ABDOMINAL HYSTERECTOMY  1982  . APPENDECTOMY    . BREAST BIOPSY Left 11/23/2014   Procedure: BREAST BIOPSY WITH NEEDLE LOCALIZATION;  Surgeon: Leonie Green, MD;  Location: [facility] ORS;  Service: General;  Laterality: Left;  . BREAST BIOPSY Left 09/25/2014   complex sclerosing lesion  . BREAST BIOPSY Right 01/13/2017   2 area/path pending  . BREAST EXCISIONAL BIOPSY Right 02/20/2017   path pending  . BREAST LUMPECTOMY Left 11/23/2014   complex sclerosing lesion excised.  Marland Kitchen BREAST LUMPECTOMY WITH NEEDLE LOCALIZATION Right 02/20/2017   Procedure: EXCISION OF RIGHT BREAST MASS WITH NEEDLE LOCALIZATION;  Surgeon: Leonie Green,  MD;  Location: [facility] ORS;  Service: General;  Laterality: Right;  . CHOLECYSTECTOMY    . DILATION AND CURETTAGE OF UTERUS    . ESOPHAGOGASTRODUODENOSCOPY (EGD) WITH PROPOFOL N/A 06/12/2017   Procedure: ESOPHAGOGASTRODUODENOSCOPY (EGD) WITH PROPOFOL;  Surgeon: Manya Silvas, MD;  Location: [facility] ENDOSCOPY;  Service: Endoscopy;  Laterality: N/A;  . Partial hystrectomy  1982  . SHOULDER ARTHROSCOPY WITH OPEN ROTATOR CUFF  REPAIR Left 06/26/2017   Procedure: SHOULDER ARTHROSCOPY WITH OPEN ROTATOR CUFF REPAIR;  Surgeon: Leim Fabry, MD;  Location: [facility] ORS;  Service: Orthopedics;  Laterality: Left;  . WHIPPLE PROCEDURE  2017   thought pt had a slow growing pancreatic mass but it came back benign    Family History  Problem Relation Age of Onset  . Lung cancer Mother   . Cancer Mother   . Stomach cancer Father   . CVA Father   . Cancer Father   . Lung cancer Brother   . Cancer Brother   . Bladder Cancer Neg Hx   . Prostate cancer Neg Hx   . Breast cancer Neg Hx     Social History:  reports that  has never smoked. she has never used smokeless tobacco. She reports that she does not drink alcohol or use drugs.  Patient has never smoked or used alcohol. She denies any known exposures to radiations or toxins. 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 She lives in [place]. The patient is alone today.  Allergies:  Allergies  Allergen Reactions  . Ambien [Zolpidem] Itching and Rash  . Penicillins Itching and Rash    Has patient had a PCN reaction causing immediate rash, facial/tongue/throat swelling, SOB or lightheadedness with hypotension: No Has patient had a PCN reaction causing severe rash involving mucus membranes or skin necrosis: No Has patient had a PCN reaction that required hospitalization: No Has patient had a PCN reaction occurring within the last 10 years: No If all of the above answers are "NO", then may proceed with Cephalosporin use.     Current Medications: Current Outpatient Medications  Medication Sig Dispense Refill  . acetaminophen (TYLENOL) 500 MG tablet Take 2 tablets (1,000 mg total) by mouth every 8 (eight) hours. 90 tablet 2  . atorvastatin (LIPITOR) 10 MG tablet Take 10 mg by mouth at bedtime.     . Carboxymethylcellul-Glycerin (LUBRICATING EYE DROPS OP) Place 1 drop into both eyes daily as needed (dry eyes).    . Cholecalciferol (VITAMIN  D3) 5000 units CAPS Take 5,000 Units by mouth daily.    . Cyanocobalamin (VITAMIN B12) 3000 MCG SUBL Place 3,000 Units under the tongue daily.    Marland Kitchen omeprazole (PRILOSEC) 40 MG capsule Take 40 mg by mouth daily.    . ondansetron (ZOFRAN ODT) 4 MG disintegrating tablet Take 1 tablet (4 mg total) by mouth every 8 (eight) hours as needed for nausea or vomiting. 20 tablet 0  . oxyCODONE (ROXICODONE) 5 MG immediate release tablet Take 1-2 tablets (5-10 mg total) by mouth every 4 (four) hours as needed (pain). 30 tablet 0  . tamoxifen (NOLVADEX) 20 MG tablet Take 1 tablet (20 mg total) by mouth daily. 90 tablet 3  . tolterodine (DETROL LA) 4 MG 24 hr capsule Take 4 mg by mouth daily.    . Multiple Vitamin (MULTI-VITAMINS) TABS Take 1 tablet by mouth daily.     No current facility-administered medications for this visit.     Review of Systems:  GENERAL:  Feels "pretty good".  "I'm getting there".  No fevers or sweats. Weight up 1 pound.  PERFORMANCE STATUS (ECOG):  1 HEENT:  Hoarse voice when dry.  Vision change secondary to cataracts.  No runny nose, sore throat, mouth sores or tenderness. Lungs: No shortness of breath or cough.  No hemoptysis. Cardiac:  No chest pain, palpitations, orthopnea, or PND. GI:  No nausea, vomiting, diarrhea, constipation, melena or hematochezia. GU:  No urgency, frequency, dysuria, or hematuria. Musculoskeletal: Chronic shoulder pain.  No back pain. No muscle tenderness. Extremities:  s/p LEFT rotator cuff repair; in sling and swathe. No pain or swelling. Skin:  No rashes or skin changes. Neuro:  No headache, numbness or weakness, balance or coordination issues. Endocrine:  No diabetes, thyroid issues, hot flashes or night sweats. Psych:  No mood changes, depression or anxiety. Pain:  No pain today. Review of systems:  All other systems reviewed and found to be negative.  Physical Exam: Temperature 97.8 F (36.6 C), resp. rate 18, weight 135 lb 2.3 oz (61.3  kg). GENERAL:  Well developed, well nourished, woman sitting comfortably in the exam room in no acute distress. MENTAL STATUS:  Alert and oriented to person, place and time. HEAD:  Curly gray hair.  Normocephalic, atraumatic, face symmetric, EYES:  Blue eyes.  Pupils equal round and reactive to light and accomodation.  No conjunctivitis or scleral icterus. ENT:  Oropharynx clear without lesion.  Tongue normal. Mucous membranes moist.  RESPIRATORY:  Clear to auscultation without rales, wheezes or rhonchi. CARDIOVASCULAR:  Regular rate and rhythm without murmur, rub or gallop. ABDOMEN:  Soft, non-tender, with active bowel sounds, and no hepatosplenomegaly.  No masses. SKIN:  No rashes, ulcers or lesions. EXTREMITIES: s/p LEFT rotator cuff repair; in sling and swathe. No edema, no skin discoloration or tenderness.  No palpable cords. LYMPH NODES: No palpable cervical, supraclavicular, axillary or inguinal adenopathy  NEUROLOGICAL: Unremarkable. PSYCH:  Appropriate   Appointment on 07/13/2017  Component Date Value Ref Range Status  . Ferritin 07/13/2017 162  11 - 307 ng/mL Final   Performed at [facility], [address] [place], [place] [address]  . Sed Rate 07/13/2017 20  0 - 30 mm/hr Final   Performed at [facility], [address] [place], [place] [address]  . Iron 07/13/2017 43  28 - 170 ug/dL Final  . TIBC 07/13/2017 303  250 - 450 ug/dL Final  . Saturation Ratios 07/13/2017 14  10.4 - 31.8 % Final  . UIBC 07/13/2017 260  ug/dL Final   Performed at [facility], [address], [place], [place] [address]  . WBC 07/13/2017 9.5  3.6 - 11.0 K/uL Final  . RBC 07/13/2017 4.66  3.80 - 5.20 MIL/uL Final  . Hemoglobin 07/13/2017 12.6  12.0 - 16.0 g/dL Final  . HCT 07/13/2017 37.9  35.0 - 47.0 % Final  . MCV 07/13/2017 81.4  80.0 - 100.0 fL Final  . MCH 07/13/2017 27.1  26.0 - 34.0 pg Final  . MCHC 07/13/2017 33.2  32.0 - 36.0 g/dL Final  . RDW  07/13/2017 17.1* 11.5 - 14.5 % Final  . Platelets 07/13/2017 315  150 - 440 K/uL Final  . Neutrophils Relative % 07/13/2017 79  % Final  . Neutro Abs 07/13/2017 7.5* 1.4 - 6.5 K/uL Final  . Lymphocytes Relative 07/13/2017 13  % Final  . Lymphs Abs 07/13/2017 1.2  1.0 - 3.6 K/uL Final  . Monocytes Relative 07/13/2017 4  %  Final  . Monocytes Absolute 07/13/2017 0.4  0.2 - 0.9 K/uL Final  . Eosinophils Relative 07/13/2017 3  % Final  . Eosinophils Absolute 07/13/2017 0.3  0 - 0.7 K/uL Final  . Basophils Relative 07/13/2017 1  % Final  . Basophils Absolute 07/13/2017 0.1  0 - 0.1 K/uL Final   Performed at [facility], [address] [place], [place] [address]    Assessment:  DESERE GWIN is a 72 y.o. female with atypical ductal hyperplasia (ADH) s/p excision of the 2 right breast lesions on 02/20/2017.  The upper right lesion revealed biopsy cavity with residual changes of radial scar. There was columnar cell change with calcification. There was sclerosing adenosis, negative for atypia and malignancy.  The right central breast lesion revealed a small focus of atypical ductal hyperplasia (ADH), margins were negative. There is biopsy cavity with residual changes of radial scar.   Estimated breast cancer risk by the [facility] model is 4.6%.  She began tamoxifen on 03/25/2017.  She is tolerating it well.  She has a recent history of a normocytic anemia and associated thrombocytosis.  CBC on 03/11/2017 revealed a hemoglobin of 8.5, hematocrit 27.3, MCV 80.3, and platelet count of 536,000.  Iron saturations was low at 4% with a TIBC of 262.  Ferritin was slightly low at 77.  TSH was normal on 03/11/2017.  Urinlaysis was negative for blood.  B12 level was borderline low at 385.  Normal labs on 03/18/2017 included the following: DAT, folate, and MMA.  Sed rate was elevated at 116 (0-30).  Reticulocyte count was 1.4% (inappropriately low).   Ferritin has been followed: 77 on 03/11/2017, 84 on  04/21/2017, 38 on 05/29/2017, and 162 on 07/13/2017.  She received 2 unit of PRBCs during her admission (05/27/2017 - 05/29/2017). She received Venofer 200 mg on 05/29/2017 and weekly x 3 (06/03/2017 - 06/17/2017).  She was admitted to [facility] from 05/27/2017 - 05/29/2017 with symptomatic anemia.  CBC revealed a hematocrit of 18.8, hemoglobin 6.0, and MCV 80.7.  Iron saturation was 4% (low) and a TIBC of 311. Creatinine was 0.85.  Ferritin was 38 with a sed rate of 41 (0-30).  LDH was 136.  Coombs was negative.  ANA was negative.  Retic was 5.4%.  Folate and MMA were normal. She was restarted on ferous sulfate TID and started on oral B12.  Patient underwent a pancreaticoduodenectomy (Whipple procedure) on 09/19/2015. Pathology was negative for malignancy.  Colonoscopy was negative 4 years ago (due 2019).  EGD on 06/12/2017 revealed a normal esophagus and post-surgical deformity in the gastric antrum.  One of 5 guaiac cards were positive from the [facility]. She denied an melena or hematochezia.  Diet was poor secondary to shoulder related pain.  She denied ice pica.  She was on oral iron 3 x a day.  She underwent left shoulder surgery on 06/26/2017  Symptomatically, she feels better.  She is 3 weeks s/p rotator cuff surgery. She presents in sling and swathe.  Pain is controlled.  Exam stable.    Plan: 1.  Review labs from 07/13/2017.  Hemoglobin is normal.  MCV is normal.  Ferritin is normal. 2.  Review interval EGD- no evidence of bleeding. 3.  Discuss iron stores. Ferritin 162. No Venofer today. 4.  Discuss plan for colonoscopy in mid 08/2017. 5.  Discuss plan for mammogram in 12/2017 (ordered by Dr. Doy Hutching). 6.  RTC in 3 months for labs (CBC with diff, ferritin). 7.  RTC  in 6 months for MD assessment with breast exam, labs (CBC with diff, ferritin - day before), and +/- Venofer.   Honor Loh, NP  07/15/2017, 11:07 AM  I saw and evaluated the patient, participating in the key portions of  the service and reviewing pertinent diagnostic studies and records.  I reviewed the nurse practitioner's note and agree with the findings and the plan.  The assessment and plan were discussed with the patient.  Several questions were asked by the patient and answered.   Nolon Stalls, MD 07/15/2017,11:07 AM

## 2017-07-15 ENCOUNTER — Encounter: Payer: Self-pay | Admitting: Hematology and Oncology

## 2017-07-15 ENCOUNTER — Inpatient Hospital Stay: Payer: Medicare Other

## 2017-07-15 ENCOUNTER — Inpatient Hospital Stay (HOSPITAL_BASED_OUTPATIENT_CLINIC_OR_DEPARTMENT_OTHER): Payer: Medicare Other | Admitting: Hematology and Oncology

## 2017-07-15 VITALS — Temp 97.8°F | Resp 18 | Wt 135.1 lb

## 2017-07-15 DIAGNOSIS — R700 Elevated erythrocyte sedimentation rate: Secondary | ICD-10-CM | POA: Diagnosis not present

## 2017-07-15 DIAGNOSIS — D509 Iron deficiency anemia, unspecified: Secondary | ICD-10-CM

## 2017-07-15 DIAGNOSIS — N6091 Unspecified benign mammary dysplasia of right breast: Secondary | ICD-10-CM

## 2017-07-15 DIAGNOSIS — Z79899 Other long term (current) drug therapy: Secondary | ICD-10-CM

## 2017-07-15 DIAGNOSIS — Z79810 Long term (current) use of selective estrogen receptor modulators (SERMs): Secondary | ICD-10-CM

## 2017-07-15 NOTE — Progress Notes (Signed)
Patient had left shoulder repair three weeks ago.  States she is doing well.

## 2017-07-20 ENCOUNTER — Other Ambulatory Visit: Payer: Medicare Other

## 2017-07-22 ENCOUNTER — Ambulatory Visit: Payer: Medicare Other | Admitting: Hematology and Oncology

## 2017-08-28 ENCOUNTER — Encounter: Payer: Self-pay | Admitting: *Deleted

## 2017-08-31 ENCOUNTER — Ambulatory Visit: Payer: Medicare Other | Admitting: Certified Registered"

## 2017-08-31 ENCOUNTER — Other Ambulatory Visit: Payer: Self-pay

## 2017-08-31 ENCOUNTER — Ambulatory Visit
Admission: RE | Admit: 2017-08-31 | Discharge: 2017-08-31 | Disposition: A | Discharge status: Home / Self Care | Payer: Medicare Other | Source: Ambulatory Visit | Attending: Unknown Physician Specialty | Admitting: Unknown Physician Specialty

## 2017-08-31 ENCOUNTER — Encounter
Admission: RE | Disposition: A | Discharge status: Home / Self Care | Payer: Self-pay | Source: Ambulatory Visit | Attending: Unknown Physician Specialty

## 2017-08-31 ENCOUNTER — Encounter: Payer: Self-pay | Admitting: Student

## 2017-08-31 DIAGNOSIS — Z7982 Long term (current) use of aspirin: Secondary | ICD-10-CM | POA: Insufficient documentation

## 2017-08-31 DIAGNOSIS — K640 First degree hemorrhoids: Secondary | ICD-10-CM | POA: Diagnosis not present

## 2017-08-31 DIAGNOSIS — K219 Gastro-esophageal reflux disease without esophagitis: Secondary | ICD-10-CM | POA: Diagnosis not present

## 2017-08-31 DIAGNOSIS — Z801 Family history of malignant neoplasm of trachea, bronchus and lung: Secondary | ICD-10-CM | POA: Diagnosis not present

## 2017-08-31 DIAGNOSIS — D509 Iron deficiency anemia, unspecified: Secondary | ICD-10-CM | POA: Diagnosis present

## 2017-08-31 DIAGNOSIS — M8580 Other specified disorders of bone density and structure, unspecified site: Secondary | ICD-10-CM | POA: Diagnosis not present

## 2017-08-31 DIAGNOSIS — E785 Hyperlipidemia, unspecified: Secondary | ICD-10-CM | POA: Insufficient documentation

## 2017-08-31 DIAGNOSIS — J309 Allergic rhinitis, unspecified: Secondary | ICD-10-CM | POA: Diagnosis not present

## 2017-08-31 DIAGNOSIS — Z880 Allergy status to penicillin: Secondary | ICD-10-CM | POA: Insufficient documentation

## 2017-08-31 DIAGNOSIS — Z85828 Personal history of other malignant neoplasm of skin: Secondary | ICD-10-CM | POA: Insufficient documentation

## 2017-08-31 DIAGNOSIS — D122 Benign neoplasm of ascending colon: Secondary | ICD-10-CM | POA: Insufficient documentation

## 2017-08-31 DIAGNOSIS — N393 Stress incontinence (female) (male): Secondary | ICD-10-CM | POA: Diagnosis not present

## 2017-08-31 DIAGNOSIS — Z823 Family history of stroke: Secondary | ICD-10-CM | POA: Insufficient documentation

## 2017-08-31 DIAGNOSIS — M1990 Unspecified osteoarthritis, unspecified site: Secondary | ICD-10-CM | POA: Insufficient documentation

## 2017-08-31 DIAGNOSIS — Z90710 Acquired absence of both cervix and uterus: Secondary | ICD-10-CM | POA: Diagnosis not present

## 2017-08-31 DIAGNOSIS — Z800 Family history of malignant neoplasm of digestive organs: Secondary | ICD-10-CM | POA: Insufficient documentation

## 2017-08-31 DIAGNOSIS — K5790 Diverticulosis of intestine, part unspecified, without perforation or abscess without bleeding: Secondary | ICD-10-CM | POA: Diagnosis not present

## 2017-08-31 DIAGNOSIS — Z9049 Acquired absence of other specified parts of digestive tract: Secondary | ICD-10-CM | POA: Diagnosis not present

## 2017-08-31 DIAGNOSIS — I878 Other specified disorders of veins: Secondary | ICD-10-CM | POA: Insufficient documentation

## 2017-08-31 DIAGNOSIS — R011 Cardiac murmur, unspecified: Secondary | ICD-10-CM | POA: Insufficient documentation

## 2017-08-31 DIAGNOSIS — Z888 Allergy status to other drugs, medicaments and biological substances status: Secondary | ICD-10-CM | POA: Insufficient documentation

## 2017-08-31 DIAGNOSIS — M5030 Other cervical disc degeneration, unspecified cervical region: Secondary | ICD-10-CM | POA: Diagnosis not present

## 2017-08-31 HISTORY — DX: Benign neoplasm of pancreas: D13.6

## 2017-08-31 HISTORY — PX: COLONOSCOPY WITH PROPOFOL: SHX5780

## 2017-08-31 SURGERY — COLONOSCOPY WITH PROPOFOL
Anesthesia: General

## 2017-08-31 MED ORDER — LIDOCAINE HCL (PF) 2 % IJ SOLN
INTRAMUSCULAR | Status: AC
  Filled 2017-08-31: qty 10

## 2017-08-31 MED ORDER — PROPOFOL 500 MG/50ML IV EMUL
INTRAVENOUS | Status: DC | PRN
  Administered 2017-08-31: 130 ug/kg/min via INTRAVENOUS

## 2017-08-31 MED ORDER — PROPOFOL 10 MG/ML IV BOLUS
INTRAVENOUS | Status: DC | PRN
  Administered 2017-08-31: 60 mg via INTRAVENOUS

## 2017-08-31 MED ORDER — SODIUM CHLORIDE 0.9 % IV SOLN
INTRAVENOUS | Status: DC
  Administered 2017-08-31: 10:00:00 via INTRAVENOUS

## 2017-08-31 MED ORDER — SODIUM CHLORIDE 0.9 % IV SOLN: INTRAVENOUS | Status: DC

## 2017-08-31 MED ORDER — PROPOFOL 500 MG/50ML IV EMUL
INTRAVENOUS | Status: AC
  Filled 2017-08-31: qty 50

## 2017-08-31 MED ORDER — LIDOCAINE HCL (CARDIAC) 20 MG/ML IV SOLN
INTRAVENOUS | Status: DC | PRN
  Administered 2017-08-31: 50 mg via INTRAVENOUS

## 2017-08-31 NOTE — Anesthesia Post-op Follow-up Note (Signed)
Anesthesia QCDR form completed.        

## 2017-08-31 NOTE — Op Note (Signed)
[facility] Gastroenterology Patient Name: Tabitha Rice Procedure Date: 08/31/2017 9:34 AM MRN: [id number] Account #: [id number] Date of Birth: 15-May-1946 Admit Type: Outpatient Age: 72 Room: [facility] ENDO ROOM 1 Gender: Female Note Status: Finalized Procedure:            Colonoscopy Indications:          Unexplained iron deficiency anemia Providers:            Manya Silvas, MD Referring MD:         Leonie Douglas. Doy Hutching, MD (Referring MD) Medicines:            Propofol per Anesthesia Complications:        No immediate complications. Procedure:            Pre-Anesthesia Assessment:                       - After reviewing the risks and benefits, the patient                        was deemed in satisfactory condition to undergo the                        procedure.                       After obtaining informed consent, the colonoscope was                        passed under direct vision. Throughout the procedure,                        the patient's blood pressure, pulse, and oxygen                        saturations were monitored continuously. The                        Colonoscope was introduced through the anus and                        advanced to the the cecum, identified by appendiceal                        orifice and ileocecal valve. The colonoscopy was                        performed without difficulty. The patient tolerated the                        procedure well. The quality of the bowel preparation                        was good. Findings:      A diminutive polyp was found in the ascending colon. The polyp was       sessile. The polyp was removed with a jumbo cold forceps. Resection and       retrieval were complete.      A few small-mouthed diverticula were found in the sigmoid colon and       descending colon.      Internal hemorrhoids were found during endoscopy. The hemorrhoids were  small and Grade I (internal hemorrhoids that do not  prolapse). Impression:           - One diminutive polyp in the ascending colon, removed                        with a jumbo cold forceps. Resected and retrieved.                       - Diverticulosis in the sigmoid colon and in the                        descending colon.                       - Internal hemorrhoids. Recommendation:       - Await pathology results. Manya Silvas, MD 08/31/2017 10:05:13 AM This report has been signed electronically. Number of Addenda: 0 Note Initiated On: 08/31/2017 9:34 AM Scope Withdrawal Time: 0 hours 9 minutes 31 seconds  Total Procedure Duration: 0 hours 14 minutes 3 seconds       [facility]

## 2017-08-31 NOTE — H&P (Signed)
Primary Care Physician:  Idelle Crouch, MD Primary Gastroenterologist:  Dr. Vira Agar  Pre-Procedure History & Physical: HPI:  Tabitha Rice is a 72 y.o. female is here for an colonoscopy.  Here for iron def anemia.   Past Medical History:  Diagnosis Date  . Allergic rhinitis 02/12/2015  . Anemia   . Arthritis    osteoarthritis  . Arthritis, degenerative 02/12/2015  . Basal cell carcinoma of cheek 10/25/2012   Overview:  Basal cell carcinoma of left medial cheek treated with Mohs surgery on 10/25/2012.   Marland Kitchen Basal cell carcinoma of face 01/28/2012   Overview:  BCC of left medial cheek treated with Mohs surgery 01-28-12   . CA skin, basal cell 02/12/2015  . Climacteric 02/12/2015  . DDD (degenerative disc disease), cervical   . DDD (degenerative disc disease), cervical   . Edema, peripheral 02/12/2015  . Female genuine stress incontinence 02/12/2015  . GERD (gastroesophageal reflux disease)   . Heart murmur   . HLD (hyperlipidemia) 02/12/2015  . Hyperlipidemia   . Injury of eyeball 02/12/2015   Overview:  LEFT RETINAL TRAUMA   . Inverted nipple    left nipple inverted, nothing new  . Osteopenia 02/12/2015  . Pancreatic cystadenoma   . Skin cancer, basal cell   . Stasis, venous 02/12/2015  . Wears contact lenses     Past Surgical History:  Procedure Laterality Date  . ABDOMINAL HYSTERECTOMY  1982  . APPENDECTOMY    . BREAST BIOPSY Left 11/23/2014   Procedure: BREAST BIOPSY WITH NEEDLE LOCALIZATION;  Surgeon: Leonie Green, MD;  Location: [facility] ORS;  Service: General;  Laterality: Left;  . BREAST BIOPSY Left 09/25/2014   complex sclerosing lesion  . BREAST BIOPSY Right 01/13/2017   2 area/path pending  . BREAST EXCISIONAL BIOPSY Right 02/20/2017   path pending  . BREAST LUMPECTOMY Left 11/23/2014   complex sclerosing lesion excised.  Marland Kitchen BREAST LUMPECTOMY WITH NEEDLE LOCALIZATION Right 02/20/2017   Procedure: EXCISION OF RIGHT BREAST MASS WITH NEEDLE LOCALIZATION;  Surgeon:  Leonie Green, MD;  Location: [facility] ORS;  Service: General;  Laterality: Right;  . CHOLECYSTECTOMY    . colonoscopy with polypectomy    . DILATION AND CURETTAGE OF UTERUS    . ESOPHAGOGASTRODUODENOSCOPY (EGD) WITH PROPOFOL N/A 06/12/2017   Procedure: ESOPHAGOGASTRODUODENOSCOPY (EGD) WITH PROPOFOL;  Surgeon: Manya Silvas, MD;  Location: [facility] ENDOSCOPY;  Service: Endoscopy;  Laterality: N/A;  . Partial hystrectomy  1982  . SHOULDER ARTHROSCOPY WITH OPEN ROTATOR CUFF REPAIR Left 06/26/2017   Procedure: SHOULDER ARTHROSCOPY WITH OPEN ROTATOR CUFF REPAIR;  Surgeon: Leim Fabry, MD;  Location: [facility] ORS;  Service: Orthopedics;  Laterality: Left;  . 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 Authorizing Provider  acetaminophen (TYLENOL) 500 MG tablet Take 2 tablets (1,000 mg total) by mouth every 8 (eight) hours. 06/26/17 06/26/18 Yes Leim Fabry, MD  aspirin 81 MG chewable tablet Chew 81 mg by mouth daily.   Yes [provider]  atorvastatin (LIPITOR) 10 MG tablet Take 10 mg by mouth at bedtime.    Yes [provider]  Carboxymethylcellul-Glycerin (LUBRICATING EYE DROPS OP) Place 1 drop into both eyes daily as needed (dry eyes).   Yes [provider]  omeprazole (PRILOSEC) 40 MG capsule Take 40 mg by mouth daily.   Yes [provider]  ondansetron (ZOFRAN ODT) 4 MG disintegrating tablet Take 1 tablet (4 mg total) by mouth every 8 (eight) hours as needed for nausea or vomiting. 06/26/17  Yes Leim Fabry, MD  tamoxifen (NOLVADEX) 20 MG tablet Take 1 tablet (20 mg total) by mouth daily. 04/22/17  Yes Karen Kitchens, NP  tolterodine (DETROL LA) 4 MG 24 hr capsule Take 4 mg by mouth daily.   Yes [provider]  Cholecalciferol (VITAMIN D3) 5000 units CAPS Take 5,000 Units by mouth daily.    [provider]   Cyanocobalamin (VITAMIN B12) 3000 MCG SUBL Place 3,000 Units under the tongue daily.    [provider]  Multiple Vitamin (MULTI-VITAMINS) TABS Take 1 tablet by mouth daily.    [provider]  oxyCODONE (ROXICODONE) 5 MG immediate release tablet Take 1-2 tablets (5-10 mg total) by mouth every 4 (four) hours as needed (pain). Patient not taking: Reported on 08/31/2017 06/26/17 06/26/18  Leim Fabry, MD    Allergies as of 08/28/2017 - Review Complete 08/28/2017  Allergen Reaction Noted  . Ambien [zolpidem] Itching and Rash 11/14/2014  . Penicillins Itching and Rash 11/14/2014    Family History  Problem Relation Age of Onset  . Lung cancer Mother   . Cancer Mother   . Stomach cancer Father   . CVA Father   . Cancer Father   . Lung cancer Brother   . Cancer Brother   . Bladder Cancer Neg Hx   . Prostate cancer Neg Hx   . Breast cancer Neg Hx     Social History   Socioeconomic History  . Marital status: Married    Spouse name: Not on file  . Number of children: Not on file  . Years of education: Not on file  . Highest education level: Not on file  Occupational History  . Not on file  Social Needs  . Financial resource strain: Not on file  . Food insecurity:    Worry: Not on file    Inability: Not on file  . Transportation needs:    Medical: Not on file    Non-medical: Not on file  Tobacco Use  . Smoking status: Never Smoker  . Smokeless tobacco: Never Used  Substance and Sexual Activity  . Alcohol use: No  . Drug use: No  . Sexual activity: Not on file  Lifestyle  . Physical activity:    Days per week: Not on file    Minutes per session: Not on file  . Stress: Not on file  Relationships  . Social connections:    Talks on phone: Not on file    Gets together: Not on file    Attends religious service: Not on file    Active member of club or organization: Not on file    Attends meetings of clubs or organizations: Not on file    Relationship  status: Not on file  . Intimate partner violence:    Fear of current or ex partner: Not on file    Emotionally abused: Not on file    Physically abused: Not on file    Forced sexual activity: Not on file  Other Topics Concern  . Not on file  Social History Narrative  . Not on file    Review of Systems: See HPI, otherwise negative ROS  Physical Exam: BP (!) 159/72   Pulse 78   Temp (!) 97.1 F (36.2 C) (Tympanic)   Resp 20   Ht 5\' 3"  (1.6 m)  Wt 60.8 kg (134 lb)   SpO2 100%   BMI 23.74 kg/m  General:   Alert,  pleasant and cooperative in NAD Head:  Normocephalic and atraumatic. Neck:  Supple; no masses or thyromegaly. Lungs:  Clear throughout to auscultation.    Heart:  Regular rate and rhythm. Abdomen:  Soft, nontender and nondistended. Normal bowel sounds, without guarding, and without rebound.   Neurologic:  Alert and  oriented x4;  grossly normal neurologically.  Impression/Plan: Binnie Rail is here for an colonoscopy to be performed for iron def anemia  Risks, benefits, limitations, and alternatives regarding  colonoscopy have been reviewed with the patient.  Questions have been answered.  All parties agreeable.   Gaylyn Cheers, MD  08/31/2017, 9:43 AM

## 2017-08-31 NOTE — Anesthesia Postprocedure Evaluation (Signed)
Anesthesia Post Note  Patient: Tabitha Rice  Procedure(s) Performed: COLONOSCOPY WITH PROPOFOL (N/A )  Patient location during evaluation: Endoscopy Anesthesia Type: General Level of consciousness: awake and alert Pain management: pain level controlled Vital Signs Assessment: post-procedure vital signs reviewed and stable Respiratory status: spontaneous breathing, nonlabored ventilation, respiratory function stable and patient connected to nasal cannula oxygen Cardiovascular status: blood pressure returned to baseline and stable Postop Assessment: no apparent nausea or vomiting Anesthetic complications: no     Last Vitals:  Vitals:   08/31/17 1026 08/31/17 1036  BP: 129/73 (!) 142/64  Pulse: 71 65  Resp: (!) 21 15  Temp:    SpO2: 100% 100%    Last Pain:  Vitals:   08/31/17 1036  TempSrc:   PainSc: 0-No pain                 [name]

## 2017-08-31 NOTE — Transfer of Care (Signed)
Immediate Anesthesia Transfer of Care Note  Patient: CAMMI CONSALVO  Procedure(s) Performed: COLONOSCOPY WITH PROPOFOL (N/A )  Patient Location: PACU  Anesthesia Type:General  Level of Consciousness: awake and responds to stimulation  Airway & Oxygen Therapy: Patient Spontanous Breathing and Patient connected to nasal cannula oxygen  Post-op Assessment: Report given to RN and Post -op Vital signs reviewed and stable  Post vital signs: Reviewed and stable  Last Vitals:  Vitals Value Taken Time  BP 112/60 08/31/2017 10:07 AM  Temp 36.1 C 08/31/2017 10:06 AM  Pulse 69 08/31/2017 10:07 AM  Resp 12 08/31/2017 10:07 AM  SpO2 99 % 08/31/2017 10:07 AM    Last Pain:  Vitals:   08/31/17 1006  TempSrc: Tympanic  PainSc: 0-No pain         Complications: No apparent anesthesia complications

## 2017-08-31 NOTE — Anesthesia Procedure Notes (Signed)
Performed by: Adith Tejada, CRNA Pre-anesthesia Checklist: Patient identified, Emergency Drugs available, Suction available, Patient being monitored and Timeout performed Patient Re-evaluated:Patient Re-evaluated prior to induction Oxygen Delivery Method: Nasal cannula Induction Type: IV induction       

## 2017-08-31 NOTE — Anesthesia Preprocedure Evaluation (Signed)
Anesthesia Evaluation  Patient identified by MRN, date of birth, ID band Patient awake    Reviewed: Allergy & Precautions, NPO status , Patient's Chart, lab work & pertinent test results, reviewed documented beta blocker date and time   Airway Mallampati: II  TM Distance: >3 FB     Dental  (+) Chipped   Pulmonary           Cardiovascular      Neuro/Psych    GI/Hepatic   Endo/Other    Renal/GU      Musculoskeletal  (+) Arthritis ,   Abdominal   Peds  Hematology  (+) anemia ,   Anesthesia Other Findings   Reproductive/Obstetrics                             Anesthesia Physical Anesthesia Plan  ASA: II  Anesthesia Plan: General   Post-op Pain Management:    Induction: Intravenous  PONV Risk Score and Plan:   Airway Management Planned:   Additional Equipment:   Intra-op Plan:   Post-operative Plan:   Informed Consent: I have reviewed the patients History and Physical, chart, labs and discussed the procedure including the risks, benefits and alternatives for the proposed anesthesia with the patient or authorized representative who has indicated his/her understanding and acceptance.     Plan Discussed with: CRNA  Anesthesia Plan Comments:         Anesthesia Quick Evaluation

## 2017-09-01 LAB — SURGICAL PATHOLOGY

## 2017-09-02 ENCOUNTER — Encounter: Payer: Self-pay | Admitting: Unknown Physician Specialty

## 2017-10-14 ENCOUNTER — Inpatient Hospital Stay: Payer: Medicare Other | Attending: Hematology and Oncology

## 2017-10-14 DIAGNOSIS — D509 Iron deficiency anemia, unspecified: Secondary | ICD-10-CM | POA: Diagnosis not present

## 2017-10-14 LAB — CBC WITH DIFFERENTIAL/PLATELET
Basophils Absolute: 0 10*3/uL (ref 0–0.1)
Basophils Relative: 1 %
Eosinophils Absolute: 0.2 10*3/uL (ref 0–0.7)
Eosinophils Relative: 5 %
HCT: 36.6 % (ref 35.0–47.0)
Hemoglobin: 12.3 g/dL (ref 12.0–16.0)
Lymphocytes Relative: 18 %
Lymphs Abs: 1 10*3/uL (ref 1.0–3.6)
MCH: 28.9 pg (ref 26.0–34.0)
MCHC: 33.5 g/dL (ref 32.0–36.0)
MCV: 86.3 fL (ref 80.0–100.0)
Monocytes Absolute: 0.3 10*3/uL (ref 0.2–0.9)
Monocytes Relative: 5 %
Neutro Abs: 3.8 10*3/uL (ref 1.4–6.5)
Neutrophils Relative %: 71 %
Platelets: 266 10*3/uL (ref 150–440)
RBC: 4.25 MIL/uL (ref 3.80–5.20)
RDW: 15 % — ABNORMAL HIGH (ref 11.5–14.5)
WBC: 5.3 10*3/uL (ref 3.6–11.0)

## 2017-10-14 LAB — FERRITIN: Ferritin: 141 ng/mL (ref 11–307)

## 2017-12-31 ENCOUNTER — Other Ambulatory Visit: Payer: Self-pay

## 2017-12-31 ENCOUNTER — Encounter: Payer: Self-pay | Admitting: *Deleted

## 2018-01-05 ENCOUNTER — Inpatient Hospital Stay: Payer: Medicare Other | Attending: Hematology and Oncology

## 2018-01-05 DIAGNOSIS — Z79810 Long term (current) use of selective estrogen receptor modulators (SERMs): Secondary | ICD-10-CM | POA: Diagnosis not present

## 2018-01-05 DIAGNOSIS — N6091 Unspecified benign mammary dysplasia of right breast: Secondary | ICD-10-CM | POA: Diagnosis not present

## 2018-01-05 DIAGNOSIS — D509 Iron deficiency anemia, unspecified: Secondary | ICD-10-CM | POA: Diagnosis not present

## 2018-01-05 DIAGNOSIS — R700 Elevated erythrocyte sedimentation rate: Secondary | ICD-10-CM | POA: Diagnosis not present

## 2018-01-05 DIAGNOSIS — Z79899 Other long term (current) drug therapy: Secondary | ICD-10-CM | POA: Insufficient documentation

## 2018-01-05 LAB — CBC WITH DIFFERENTIAL/PLATELET
Basophils Absolute: 0 10*3/uL (ref 0–0.1)
Basophils Relative: 1 %
Eosinophils Absolute: 0.2 10*3/uL (ref 0–0.7)
Eosinophils Relative: 3 %
HCT: 37.3 % (ref 35.0–47.0)
Hemoglobin: 12.4 g/dL (ref 12.0–16.0)
Lymphocytes Relative: 20 %
Lymphs Abs: 1.2 10*3/uL (ref 1.0–3.6)
MCH: 29.5 pg (ref 26.0–34.0)
MCHC: 33.2 g/dL (ref 32.0–36.0)
MCV: 88.7 fL (ref 80.0–100.0)
Monocytes Absolute: 0.2 10*3/uL (ref 0.2–0.9)
Monocytes Relative: 4 %
Neutro Abs: 4.4 10*3/uL (ref 1.4–6.5)
Neutrophils Relative %: 72 %
Platelets: 255 10*3/uL (ref 150–440)
RBC: 4.21 MIL/uL (ref 3.80–5.20)
RDW: 13.2 % (ref 11.5–14.5)
WBC: 6.1 10*3/uL (ref 3.6–11.0)

## 2018-01-05 LAB — FERRITIN: Ferritin: 162 ng/mL (ref 11–307)

## 2018-01-06 ENCOUNTER — Encounter: Payer: Self-pay | Admitting: Hematology and Oncology

## 2018-01-06 ENCOUNTER — Inpatient Hospital Stay (HOSPITAL_BASED_OUTPATIENT_CLINIC_OR_DEPARTMENT_OTHER): Payer: Medicare Other | Admitting: Hematology and Oncology

## 2018-01-06 ENCOUNTER — Inpatient Hospital Stay: Payer: Medicare Other

## 2018-01-06 VITALS — BP 148/80 | HR 64 | Temp 98.1°F | Resp 18 | Wt 139.8 lb

## 2018-01-06 DIAGNOSIS — Z79810 Long term (current) use of selective estrogen receptor modulators (SERMs): Secondary | ICD-10-CM | POA: Diagnosis not present

## 2018-01-06 DIAGNOSIS — D509 Iron deficiency anemia, unspecified: Secondary | ICD-10-CM | POA: Diagnosis not present

## 2018-01-06 DIAGNOSIS — N6091 Unspecified benign mammary dysplasia of right breast: Secondary | ICD-10-CM

## 2018-01-06 DIAGNOSIS — R700 Elevated erythrocyte sedimentation rate: Secondary | ICD-10-CM

## 2018-01-06 DIAGNOSIS — Z79899 Other long term (current) drug therapy: Secondary | ICD-10-CM

## 2018-01-06 NOTE — Progress Notes (Signed)
Patient offers no complaints today. 

## 2018-01-06 NOTE — Progress Notes (Signed)
[facility] day:  01/06/2018   Chief Complaint: Tabitha Rice is a 72 y.o. female with atypical ductal hyperplasia and iron deficiency anemia who is seen for 6 month assessment.  HPI: Patient was last seen in the medical oncology clinic on 07/15/2017.  At that time, she feels better.  She was 3 weeks s/p rotator cuff surgery. Exam was stable.  Hemoglobin was normal.  MCV was normal.  Ferritin was 162.  Colonoscopy on 08/31/2017 by Dr. Vira Agar revealed one diminutive polyp in the ascending colon and diverticulosis in the sigmoid and descending colon. Pathology revealed a sessile serrated adenoma without high grade dysplasia or malignancy.  CBC on 10/14/2017 revealed a hematocrit of 36.6, hemoglobin 12.3, and MCV was 86.3.  Ferritin was 141. CBC on 01/05/2018 revealed a hematocrit of 37.3, hemoglobin 12.4, and MCV was 88.7.  Ferritin was 162.   Patient is scheduled for cataract surgery on 01/12/2018 with Dr. Wallace Going. She will have the contralateral eye done a month later.   During the interim, patient has been doing well overall. Patient has no acute complaints today. Patient denies that she has experienced any B symptoms. She denies any interval infections.  Patient does not verbalize any concerns with regards to her breasts today. Patient performs monthly self breast examinations as recommended.   Patient advises that she maintains an adequate appetite. She is eating well. Her diet has improved and she is taking a daily multivitamin. Weight today is 139 lb 12.4 oz (63.4 kg), which compared to her last visit to the clinic, represents a 4 pound increase.   Patient denies pain in the clinic today.   Past Medical History:  Diagnosis Date  . Allergic rhinitis 02/12/2015  . Anemia   . Arthritis    osteoarthritis  . Arthritis, degenerative 02/12/2015  . Basal cell carcinoma of cheek 10/25/2012   Overview:  Basal cell carcinoma of left medial cheek  treated with Mohs surgery on 10/25/2012.   Marland Kitchen Basal cell carcinoma of face 01/28/2012   Overview:  BCC of left medial cheek treated with Mohs surgery 01-28-12   . CA skin, basal cell 02/12/2015  . Climacteric 02/12/2015  . DDD (degenerative disc disease), cervical   . DDD (degenerative disc disease), cervical   . Edema, peripheral 02/12/2015  . Female genuine stress incontinence 02/12/2015  . GERD (gastroesophageal reflux disease)   . Heart murmur   . HLD (hyperlipidemia) 02/12/2015  . Hyperlipidemia   . Injury of eyeball 02/12/2015   Overview:  LEFT RETINAL TRAUMA   . Inverted nipple    left nipple inverted, nothing new  . Osteopenia 02/12/2015  . Pancreatic cystadenoma   . Skin cancer, basal cell   . Stasis, venous 02/12/2015  . Wears contact lenses     Past Surgical History:  Procedure Laterality Date  . ABDOMINAL HYSTERECTOMY  1982  . APPENDECTOMY    . BREAST BIOPSY Left 11/23/2014   Procedure: BREAST BIOPSY WITH NEEDLE LOCALIZATION;  Surgeon: Leonie Green, MD;  Location: [facility] ORS;  Service: General;  Laterality: Left;  . BREAST BIOPSY Left 09/25/2014   complex sclerosing lesion  . BREAST BIOPSY Right 01/13/2017   2 area/path pending  . BREAST EXCISIONAL BIOPSY Right 02/20/2017   path pending  . BREAST LUMPECTOMY Left 11/23/2014   complex sclerosing lesion excised.  Marland Kitchen BREAST LUMPECTOMY WITH NEEDLE LOCALIZATION Right 02/20/2017   Procedure: EXCISION OF RIGHT BREAST MASS WITH NEEDLE LOCALIZATION;  Surgeon: Leonie Green,  MD;  Location: [facility] ORS;  Service: General;  Laterality: Right;  . CHOLECYSTECTOMY    . colonoscopy with polypectomy    . COLONOSCOPY WITH PROPOFOL N/A 08/31/2017   Procedure: COLONOSCOPY WITH PROPOFOL;  Surgeon: Manya Silvas, MD;  Location: [facility] ENDOSCOPY;  Service: Endoscopy;  Laterality: N/A;  . DILATION AND CURETTAGE OF UTERUS    . ESOPHAGOGASTRODUODENOSCOPY (EGD) WITH PROPOFOL N/A 06/12/2017   Procedure: ESOPHAGOGASTRODUODENOSCOPY (EGD) WITH  PROPOFOL;  Surgeon: Manya Silvas, MD;  Location: [facility] ENDOSCOPY;  Service: Endoscopy;  Laterality: N/A;  . Partial hystrectomy  1982  . SHOULDER ARTHROSCOPY WITH OPEN ROTATOR CUFF REPAIR Left 06/26/2017   Procedure: SHOULDER ARTHROSCOPY WITH OPEN ROTATOR CUFF REPAIR;  Surgeon: Leim Fabry, MD;  Location: [facility] ORS;  Service: Orthopedics;  Laterality: Left;  . WHIPPLE PROCEDURE  2017   thought pt had a slow growing pancreatic mass but it came back benign    Family History  Problem Relation Age of Onset  . Lung cancer Mother   . Cancer Mother   . Stomach cancer Father   . CVA Father   . Cancer Father   . Lung cancer Brother   . Cancer Brother   . Bladder Cancer Neg Hx   . Prostate cancer Neg Hx   . Breast cancer Neg Hx     Social History:  reports that she has never smoked. She has never used smokeless tobacco. She reports that she does not drink alcohol or use drugs.  Patient has never smoked or used alcohol. She denies any known exposures to radiations or toxins. 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 She lives in [place]. The patient is alone today.  Allergies:  Allergies  Allergen Reactions  . Ambien [Zolpidem] Itching and Rash  . Penicillins Itching and Rash    Has patient had a PCN reaction causing immediate rash, facial/tongue/throat swelling, SOB or lightheadedness with hypotension: No Has patient had a PCN reaction causing severe rash involving mucus membranes or skin necrosis: No Has patient had a PCN reaction that required hospitalization: No Has patient had a PCN reaction occurring within the last 10 years: No If all of the above answers are "NO", then may proceed with Cephalosporin use.     Current Medications: Current Outpatient Medications  Medication Sig Dispense Refill  . acetaminophen (TYLENOL) 500 MG tablet Take 2 tablets (1,000 mg total) by mouth every 8 (eight) hours. 90 tablet 2  . aspirin 81 MG chewable  tablet Chew 81 mg by mouth daily.    Marland Kitchen atorvastatin (LIPITOR) 10 MG tablet Take 10 mg by mouth at bedtime.     . Cholecalciferol (VITAMIN D3) 5000 units CAPS Take 5,000 Units by mouth daily.    Marland Kitchen CRANBERRY PO Take by mouth daily.    . Cyanocobalamin (VITAMIN B12) 3000 MCG SUBL Place 3,000 Units under the tongue daily.    . Multiple Vitamin (MULTI-VITAMINS) TABS Take 1 tablet by mouth daily.    . tamoxifen (NOLVADEX) 20 MG tablet Take 1 tablet (20 mg total) by mouth daily. 90 tablet 3  . tolterodine (DETROL LA) 4 MG 24 hr capsule Take 4 mg by mouth daily.     No current facility-administered medications for this visit.     Review of Systems  Constitutional: Negative for diaphoresis, fever, malaise/fatigue and weight loss (up 4 pounds).       "I have been doing good".   HENT: Negative.  Hoarse voice quality when dry.  Eyes: Negative.        Cataracts; upcoming surgery.   Respiratory: Negative for cough, hemoptysis, sputum production and shortness of breath.   Cardiovascular: Negative for chest pain, palpitations, orthopnea, leg swelling and PND.  Gastrointestinal: Negative for abdominal pain, blood in stool, constipation, diarrhea, melena, nausea and vomiting.  Genitourinary: Negative for dysuria, frequency, hematuria and urgency.  Musculoskeletal: Positive for joint pain (shoulder). Negative for back pain, falls and myalgias.       Recent LEFT rotator cuff repair  Skin: Negative for itching and rash.  Neurological: Negative for dizziness, tremors, weakness and headaches.  Endo/Heme/Allergies: Does not bruise/bleed easily.  Psychiatric/Behavioral: Negative for depression, memory loss and suicidal ideas. The patient is not nervous/anxious and does not have insomnia.   All other systems reviewed and are negative.  Performance status (ECOG): 1 - Symptomatic but completely ambulatory  Vital Signs BP (!) 148/80 (BP Location: Left Arm, Patient Position: Sitting)   Pulse 64   Temp  98.1 F (36.7 C) (Tympanic)   Resp 18   Wt 139 lb 12.4 oz (63.4 kg)   SpO2 100%   BMI 24.76 kg/m   Physical Exam  Constitutional: She is oriented to person, place, and time and well-developed, well-nourished, and in no distress.  HENT:  Head: Normocephalic and atraumatic.  Gray hair.  Eyes: Pupils are equal, round, and reactive to light. Conjunctivae and EOM are normal. No scleral icterus.  Glasses.  Blue eyes.  Neck: Normal range of motion. Neck supple. No JVD present. No thyromegaly present.  Cardiovascular: Normal rate, regular rhythm and normal heart sounds. Exam reveals no gallop and no friction rub.  No murmur heard. Pulmonary/Chest: Effort normal and breath sounds normal. No respiratory distress. She has no wheezes. She has no rales.  Abdominal: Soft. Bowel sounds are normal. She exhibits no distension. There is no tenderness.  Musculoskeletal: Normal range of motion. She exhibits no edema or tenderness.  Lymphadenopathy:    She has no cervical adenopathy.  Neurological: She is alert and oriented to person, place, and time.  Skin: Skin is warm and dry. No rash noted. No erythema.  Psychiatric: Mood, affect and judgment normal.  Nursing note and vitals reviewed.    Appointment on 01/05/2018  Component Date Value Ref Range Status  . Ferritin 01/05/2018 162  11 - 307 ng/mL Final   Performed at [facility], [address] [place], [place] [address]  . WBC 01/05/2018 6.1  3.6 - 11.0 K/uL Final  . RBC 01/05/2018 4.21  3.80 - 5.20 MIL/uL Final  . Hemoglobin 01/05/2018 12.4  12.0 - 16.0 g/dL Final  . HCT 01/05/2018 37.3  35.0 - 47.0 % Final  . MCV 01/05/2018 88.7  80.0 - 100.0 fL Final  . MCH 01/05/2018 29.5  26.0 - 34.0 pg Final  . MCHC 01/05/2018 33.2  32.0 - 36.0 g/dL Final  . RDW 01/05/2018 13.2  11.5 - 14.5 % Final  . Platelets 01/05/2018 255  150 - 440 K/uL Final  . Neutrophils Relative % 01/05/2018 72  % Final  . Neutro Abs 01/05/2018 4.4  1.4 - 6.5 K/uL  Final  . Lymphocytes Relative 01/05/2018 20  % Final  . Lymphs Abs 01/05/2018 1.2  1.0 - 3.6 K/uL Final  . Monocytes Relative 01/05/2018 4  % Final  . Monocytes Absolute 01/05/2018 0.2  0.2 - 0.9 K/uL Final  . Eosinophils Relative 01/05/2018 3  % Final  . Eosinophils Absolute 01/05/2018  0.2  0 - 0.7 K/uL Final  . Basophils Relative 01/05/2018 1  % Final  . Basophils Absolute 01/05/2018 0.0  0 - 0.1 K/uL Final   Performed at [facility], [address], [place], [place] [address]    Assessment:  Tabitha Rice is a 72 y.o. female with atypical ductal hyperplasia (ADH) s/p excision of the 2 right breast lesions on 02/20/2017.  The upper right lesion revealed biopsy cavity with residual changes of radial scar. There was columnar cell change with calcification. There was sclerosing adenosis, negative for atypia and malignancy.  The right central breast lesion revealed a small focus of atypical ductal hyperplasia (ADH), margins were negative. There is biopsy cavity with residual changes of radial scar.   Estimated breast cancer risk by the [facility] model is 4.6%.  She began tamoxifen on 03/25/2017.  She is tolerating it well.  She has a recent history of a normocytic anemia and associated thrombocytosis.  CBC on 03/11/2017 revealed a hemoglobin of 8.5, hematocrit 27.3, MCV 80.3, and platelet count of 536,000.  Iron saturations was low at 4% with a TIBC of 262.  Ferritin was slightly low at 77.  TSH was normal on 03/11/2017.  Urinlaysis was negative for blood.  B12 level was borderline low at 385.  Normal labs on 03/18/2017 included the following: DAT, folate, and MMA.  Sed rate was elevated at 116 (0-30).  Reticulocyte count was 1.4% (inappropriately low).   Ferritin has been followed: 77 on 03/11/2017, 84 on 04/21/2017, 38 on 05/29/2017, 162 on 07/13/2017, 141 on 10/14/2017, and 162 on 01/05/2018.  She received 2 unit of PRBCs during her admission (05/27/2017 - 05/29/2017). She received  Venofer 200 mg on 05/29/2017 and weekly x 3 (06/03/2017 - 06/17/2017).  She was admitted to [facility] from 05/27/2017 - 05/29/2017 with symptomatic anemia.  CBC revealed a hematocrit of 18.8, hemoglobin 6.0, and MCV 80.7.  Iron saturation was 4% (low) and a TIBC of 311. Creatinine was 0.85.  Ferritin was 38 with a sed rate of 41 (0-30).  LDH was 136.  Coombs was negative.  ANA was negative.  Retic was 5.4%.  Folate and MMA were normal. She was restarted on ferous sulfate TID and started on oral B12.  Patient underwent a pancreaticoduodenectomy (Whipple procedure) on 09/19/2015. Pathology was negative for malignancy.  EGD on 06/12/2017 revealed a normal esophagus and post-surgical deformity in the gastric antrum. Colonoscopy on 08/31/2017 revealed one diminutive polyp in the ascending colon and diverticulosis in the sigmoid and descending colon. Pathology revealed a sessile serrated adenoma without high grade dysplasia or malignancy.  One of 5 guaiac cards were positive from the [facility]. She denied an melena or hematochezia.  Diet was poor secondary to shoulder related pain.  She denied ice pica.  She was on oral iron 3 x a day.  She underwent left shoulder surgery on 06/26/2017  Symptomatically, patient is doing well. She has healed from her previous rotator cuff surgery and has returned to her PLOF. She has no acute concerns. Patient denies that she has experienced any B symptoms. She denies any interval infections. No breast concerns. Exam is unremarkable.   Plan: 1. Discuss interval labs.   2. IDA - stable  Hemoglobin 12.4, hematocrit 37.3, and MCV 88.7.  Ferritin stable at 162.  No Venofer today.  3. Preventative care - ongoing  Review interval colonoscopy. No high grade dysplasia or malignancy.   Cataract surgery scheduled for 01/12/2018 with Dr. Wallace Going.  Discuss need for routine mammogram (ordered by PCP); due now. Patient wishes to defer until after cataract surgery citing  that she has "too much going on". Will see PCP in 02/2018, at which time she will ask to be sent for routine mammogram.  4. Discuss RTC prn. Patient wishes to discuss primary management by her PCP only at this time. Will discuss and return a call to the clinic. As it stands, no follow up appointments made for her in the oncology clinic.    Honor Loh, NP  01/06/2018, 12:34 PM  I saw and evaluated the patient, participating in the key portions of the service and reviewing pertinent diagnostic studies and records.  I reviewed the nurse practitioner's note and agree with the findings and the plan.  The assessment and plan were discussed with the patient.  Several questions were asked by the patient and answered.   Nolon Stalls, MD 01/06/2018,12:34 PM

## 2018-01-07 NOTE — Discharge Instructions (Signed)

## 2018-01-12 ENCOUNTER — Encounter
Admission: RE | Disposition: A | Discharge status: Home / Self Care | Payer: Self-pay | Source: Ambulatory Visit | Attending: Ophthalmology

## 2018-01-12 ENCOUNTER — Ambulatory Visit
Admission: RE | Admit: 2018-01-12 | Discharge: 2018-01-12 | Disposition: A | Discharge status: Home / Self Care | Payer: Medicare Other | Source: Ambulatory Visit | Attending: Ophthalmology | Admitting: Ophthalmology

## 2018-01-12 ENCOUNTER — Ambulatory Visit: Payer: Medicare Other | Admitting: Anesthesiology

## 2018-01-12 ENCOUNTER — Other Ambulatory Visit: Payer: Medicare Other

## 2018-01-12 DIAGNOSIS — Z79810 Long term (current) use of selective estrogen receptor modulators (SERMs): Secondary | ICD-10-CM | POA: Insufficient documentation

## 2018-01-12 DIAGNOSIS — Z853 Personal history of malignant neoplasm of breast: Secondary | ICD-10-CM | POA: Diagnosis not present

## 2018-01-12 DIAGNOSIS — Z79899 Other long term (current) drug therapy: Secondary | ICD-10-CM | POA: Diagnosis not present

## 2018-01-12 DIAGNOSIS — H2511 Age-related nuclear cataract, right eye: Secondary | ICD-10-CM | POA: Insufficient documentation

## 2018-01-12 DIAGNOSIS — E7800 Pure hypercholesterolemia, unspecified: Secondary | ICD-10-CM | POA: Diagnosis not present

## 2018-01-12 DIAGNOSIS — Z7982 Long term (current) use of aspirin: Secondary | ICD-10-CM | POA: Insufficient documentation

## 2018-01-12 HISTORY — PX: CATARACT EXTRACTION W/PHACO: SHX586

## 2018-01-12 SURGERY — PHACOEMULSIFICATION, CATARACT, WITH IOL INSERTION
Anesthesia: Monitor Anesthesia Care | Site: Eye | Laterality: Right | Wound class: "Clean "

## 2018-01-12 MED ORDER — ARMC OPHTHALMIC DILATING DROPS
1.0000 "application " | OPHTHALMIC | Status: DC | PRN
  Administered 2018-01-12 (×3): 1 via OPHTHALMIC

## 2018-01-12 MED ORDER — MIDAZOLAM HCL 2 MG/2ML IJ SOLN
INTRAMUSCULAR | Status: DC | PRN
  Administered 2018-01-12: 2 mg via INTRAVENOUS

## 2018-01-12 MED ORDER — NA HYALUR & NA CHOND-NA HYALUR 0.4-0.35 ML IO KIT
PACK | INTRAOCULAR | Status: DC | PRN
  Administered 2018-01-12: 1 mL via INTRAOCULAR

## 2018-01-12 MED ORDER — ACETAMINOPHEN 160 MG/5ML PO SOLN: 325.0000 mg | ORAL | Status: DC | PRN

## 2018-01-12 MED ORDER — FENTANYL CITRATE (PF) 100 MCG/2ML IJ SOLN
INTRAMUSCULAR | Status: DC | PRN
  Administered 2018-01-12: 50 ug via INTRAVENOUS

## 2018-01-12 MED ORDER — MOXIFLOXACIN HCL 0.5 % OP SOLN
OPHTHALMIC | Status: DC | PRN
  Administered 2018-01-12: 0.2 mL via OPHTHALMIC

## 2018-01-12 MED ORDER — LIDOCAINE HCL (PF) 2 % IJ SOLN
INTRAOCULAR | Status: DC | PRN
  Administered 2018-01-12: 1 mL

## 2018-01-12 MED ORDER — BRIMONIDINE TARTRATE-TIMOLOL 0.2-0.5 % OP SOLN
OPHTHALMIC | Status: DC | PRN
  Administered 2018-01-12: 1 [drp] via OPHTHALMIC

## 2018-01-12 MED ORDER — MOXIFLOXACIN HCL 0.5 % OP SOLN
1.0000 [drp] | OPHTHALMIC | Status: DC | PRN
  Administered 2018-01-12 (×3): 1 [drp] via OPHTHALMIC

## 2018-01-12 MED ORDER — EPINEPHRINE PF 1 MG/ML IJ SOLN
INTRAOCULAR | Status: DC | PRN
  Administered 2018-01-12: 51 mL via OPHTHALMIC

## 2018-01-12 MED ORDER — ACETAMINOPHEN 325 MG PO TABS: 325.0000 mg | ORAL_TABLET | ORAL | Status: DC | PRN

## 2018-01-12 SURGICAL SUPPLY — 27 items
ACRYSOFIQ TORIC LENS (Intraocular Lens) ×2 IMPLANT
CANNULA ANT/CHMB 27G (MISCELLANEOUS) ×1 IMPLANT
CANNULA ANT/CHMB 27GA (MISCELLANEOUS) ×3 IMPLANT
CARTRIDGE ABBOTT (MISCELLANEOUS) IMPLANT
GLOVE SURG LX 7.5 STRW (GLOVE) ×2
GLOVE SURG LX STRL 7.5 STRW (GLOVE) ×1 IMPLANT
GLOVE SURG TRIUMPH 8.0 PF LTX (GLOVE) ×3 IMPLANT
GOWN STRL REUS W/ TWL LRG LVL3 (GOWN DISPOSABLE) ×2 IMPLANT
GOWN STRL REUS W/TWL LRG LVL3 (GOWN DISPOSABLE) ×4
MARKER SKIN DUAL TIP RULER LAB (MISCELLANEOUS) ×3 IMPLANT
NDL FILTER BLUNT 18X1 1/2 (NEEDLE) ×1 IMPLANT
NDL RETROBULBAR .5 NSTRL (NEEDLE) IMPLANT
NEEDLE FILTER BLUNT 18X 1/2SAF (NEEDLE) ×2
NEEDLE FILTER BLUNT 18X1 1/2 (NEEDLE) ×1 IMPLANT
PACK CATARACT BRASINGTON (MISCELLANEOUS) ×3 IMPLANT
PACK EYE AFTER SURG (MISCELLANEOUS) ×3 IMPLANT
PACK OPTHALMIC (MISCELLANEOUS) ×3 IMPLANT
RING MALYGIN 7.0 (MISCELLANEOUS) IMPLANT
SUT ETHILON 10-0 CS-B-6CS-B-6 (SUTURE)
SUT VICRYL  9 0 (SUTURE)
SUT VICRYL 9 0 (SUTURE) IMPLANT
SUTURE EHLN 10-0 CS-B-6CS-B-6 (SUTURE) IMPLANT
SYR 3ML LL SCALE MARK (SYRINGE) ×3 IMPLANT
SYR 5ML LL (SYRINGE) ×3 IMPLANT
SYR TB 1ML LUER SLIP (SYRINGE) ×3 IMPLANT
WATER STERILE IRR 500ML POUR (IV SOLUTION) ×3 IMPLANT
WIPE NON LINTING 3.25X3.25 (MISCELLANEOUS) ×3 IMPLANT

## 2018-01-12 NOTE — Transfer of Care (Signed)
Immediate Anesthesia Transfer of Care Note  Patient: Tabitha Rice  Procedure(s) Performed: CATARACT EXTRACTION PHACO AND INTRAOCULAR LENS PLACEMENT (IOC)  RIGHT TORIC LENS ? MAYBE (Right Eye)  Patient Location: PACU  Anesthesia Type: MAC  Level of Consciousness: awake, alert  and patient cooperative  Airway and Oxygen Therapy: Patient Spontanous Breathing and Patient connected to supplemental oxygen  Post-op Assessment: Post-op Vital signs reviewed, Patient's Cardiovascular Status Stable, Respiratory Function Stable, Patent Airway and No signs of Nausea or vomiting  Post-op Vital Signs: Reviewed and stable  Complications: No apparent anesthesia complications

## 2018-01-12 NOTE — Anesthesia Preprocedure Evaluation (Signed)
Anesthesia Evaluation  Patient identified by MRN, date of birth, ID band Patient awake    Reviewed: Allergy & Precautions, H&P , NPO status , Patient's Chart, lab work & pertinent test results, reviewed documented beta blocker date and time   Airway Mallampati: II  TM Distance: >3 FB Neck ROM: full    Dental no notable dental hx.    Pulmonary neg pulmonary ROS,    Pulmonary exam normal breath sounds clear to auscultation       Cardiovascular Exercise Tolerance: Good negative cardio ROS   Rhythm:regular Rate:Normal     Neuro/Psych negative neurological ROS  negative psych ROS   GI/Hepatic Neg liver ROS, GERD  ,  Endo/Other  negative endocrine ROS  Renal/GU negative Renal ROS  negative genitourinary   Musculoskeletal   Abdominal   Peds  Hematology  (+) anemia ,   Anesthesia Other Findings   Reproductive/Obstetrics negative OB ROS                             Anesthesia Physical Anesthesia Plan  ASA: II  Anesthesia Plan: MAC   Post-op Pain Management:    Induction:   PONV Risk Score and Plan:   Airway Management Planned:   Additional Equipment:   Intra-op Plan:   Post-operative Plan:   Informed Consent: I have reviewed the patients History and Physical, chart, labs and discussed the procedure including the risks, benefits and alternatives for the proposed anesthesia with the patient or authorized representative who has indicated his/her understanding and acceptance.   Dental Advisory Given  Plan Discussed with: CRNA  Anesthesia Plan Comments:         Anesthesia Quick Evaluation

## 2018-01-12 NOTE — Anesthesia Postprocedure Evaluation (Signed)
Anesthesia Post Note  Patient: Tabitha Rice  Procedure(s) Performed: CATARACT EXTRACTION PHACO AND INTRAOCULAR LENS PLACEMENT (IOC)  RIGHT TORIC LENS ? MAYBE (Right Eye)  Patient location during evaluation: PACU Anesthesia Type: MAC Level of consciousness: awake and alert Pain management: pain level controlled Vital Signs Assessment: post-procedure vital signs reviewed and stable Respiratory status: spontaneous breathing, nonlabored ventilation, respiratory function stable and patient connected to nasal cannula oxygen Cardiovascular status: stable and blood pressure returned to baseline Postop Assessment: no apparent nausea or vomiting Anesthetic complications: no    Alisa Graff

## 2018-01-12 NOTE — Op Note (Signed)
LOCATION:  [place]   PREOPERATIVE DIAGNOSIS:  Nuclear sclerotic cataract of the right eye.  H25.11   POSTOPERATIVE DIAGNOSIS:  Nuclear sclerotic cataract of the right eye.   PROCEDURE:  Phacoemulsification with Toric posterior chamber intraocular lens placement of the right eye.   LENS:   Implant Name Type Inv. Item Serial No. Manufacturer Lot No. LRB No. Used  [id number] TORIC LENS Intraocular Lens  [id number] ALCON  Right 1     [id number] 19.5 D Toric intraocular lens with 1.5 diopters of cylindrical power with axis orientation at 65 degrees.   ULTRASOUND TIME: 18 % of 0 minutes, 55 seconds.  CDE 9.9   SURGEON:  Wyonia Hough, MD   ANESTHESIA: Topical with tetracaine drops and 2% Xylocaine jelly, augmented with 1% preservative-free intracameral lidocaine. .   COMPLICATIONS:  None.   DESCRIPTION OF PROCEDURE:  The patient was identified in the holding room and transported to the operating suite and placed in the supine position under the operating microscope.  The right eye was identified as the operative eye, and it was prepped and draped in the usual sterile ophthalmic fashion.    A clear-corneal paracentesis incision was made at the 12:00 position.  0.5 ml of preservative-free 1% lidocaine was injected into the anterior chamber. The anterior chamber was filled with Viscoat.  A 2.4 millimeter near clear corneal incision was then made at the 9:00 position.  A cystotome and capsulorrhexis forceps were then used to make a curvilinear capsulorrhexis.  Hydrodissection and hydrodelineation were then performed using balanced salt solution.   Phacoemulsification was then used in stop and chop fashion to remove the lens, nucleus and epinucleus.  The remaining cortex was aspirated using the irrigation and aspiration handpiece.  Provisc viscoelastic was then placed into the capsular bag to distend it for lens placement.  The Verion digital marker was used to align the implant at  the intended axis.   A Toric lens was then injected into the capsular bag.  It was rotated clockwise until the axis marks on the lens were approximately 15 degrees in the counterclockwise direction to the intended alignment.  The viscoelastic was aspirated from the eye using the irrigation aspiration handpiece.  Then, a Koch spatula through the sideport incision was used to rotate the lens in a clockwise direction until the axis markings of the intraocular lens were lined up with the Verion alignment.  Balanced salt solution was then used to hydrate the wounds. Vigamox 0.2 ml of a 1mg  per ml solution was injected into the anterior chamber for a dose of 0.2 mg of intracameral antibiotic at the completion of the case.    The eye was noted to have a physiologic pressure and there was no wound leak noted.   Timolol and Brimonidine drops were applied to the eye.  The patient was taken to the recovery room in stable condition having had no complications of anesthesia or surgery.  Mavi Un 01/12/2018, 11:39 AM

## 2018-01-12 NOTE — H&P (Signed)
The History and Physical notes are on paper, have been signed, and are to be scanned. The patient remains stable and unchanged from the H&P.   Previous H&P reviewed, patient examined, and there are no changes.  Tabitha Rice 01/12/2018 10:14 AM

## 2018-01-12 NOTE — Anesthesia Procedure Notes (Signed)
Procedure Name: MAC Date/Time: 01/12/2018 11:16 AM Performed by: Janna Arch, CRNA Pre-anesthesia Checklist: Patient identified, Emergency Drugs available, Suction available and Patient being monitored Patient Re-evaluated:Patient Re-evaluated prior to induction Oxygen Delivery Method: Nasal cannula

## 2018-01-13 ENCOUNTER — Ambulatory Visit: Payer: Medicare Other | Admitting: Hematology and Oncology

## 2018-01-13 ENCOUNTER — Ambulatory Visit: Payer: Medicare Other

## 2018-01-28 NOTE — Discharge Instructions (Signed)

## 2018-02-03 ENCOUNTER — Encounter
Admission: RE | Disposition: A | Discharge status: Home / Self Care | Payer: Self-pay | Source: Ambulatory Visit | Attending: Ophthalmology

## 2018-02-03 ENCOUNTER — Ambulatory Visit: Payer: Medicare Other | Admitting: Anesthesiology

## 2018-02-03 ENCOUNTER — Ambulatory Visit
Admission: RE | Admit: 2018-02-03 | Discharge: 2018-02-03 | Disposition: A | Discharge status: Home / Self Care | Payer: Medicare Other | Source: Ambulatory Visit | Attending: Ophthalmology | Admitting: Ophthalmology

## 2018-02-03 DIAGNOSIS — Z7982 Long term (current) use of aspirin: Secondary | ICD-10-CM | POA: Insufficient documentation

## 2018-02-03 DIAGNOSIS — Z79810 Long term (current) use of selective estrogen receptor modulators (SERMs): Secondary | ICD-10-CM | POA: Insufficient documentation

## 2018-02-03 DIAGNOSIS — Z79899 Other long term (current) drug therapy: Secondary | ICD-10-CM | POA: Insufficient documentation

## 2018-02-03 DIAGNOSIS — Z853 Personal history of malignant neoplasm of breast: Secondary | ICD-10-CM | POA: Diagnosis not present

## 2018-02-03 DIAGNOSIS — H2512 Age-related nuclear cataract, left eye: Secondary | ICD-10-CM | POA: Insufficient documentation

## 2018-02-03 HISTORY — PX: CATARACT EXTRACTION W/PHACO: SHX586

## 2018-02-03 SURGERY — PHACOEMULSIFICATION, CATARACT, WITH IOL INSERTION
Anesthesia: Monitor Anesthesia Care | Laterality: Left

## 2018-02-03 MED ORDER — BRIMONIDINE TARTRATE-TIMOLOL 0.2-0.5 % OP SOLN
OPHTHALMIC | Status: DC | PRN
  Administered 2018-02-03: 1 [drp] via OPHTHALMIC

## 2018-02-03 MED ORDER — LACTATED RINGERS IV SOLN: 10.0000 mL/h | INTRAVENOUS | Status: DC

## 2018-02-03 MED ORDER — MOXIFLOXACIN HCL 0.5 % OP SOLN
1.0000 [drp] | OPHTHALMIC | Status: DC | PRN
  Administered 2018-02-03 (×3): 1 [drp] via OPHTHALMIC

## 2018-02-03 MED ORDER — ONDANSETRON HCL 4 MG/2ML IJ SOLN: 4.0000 mg | Freq: Once | INTRAMUSCULAR | Status: DC | PRN

## 2018-02-03 MED ORDER — LIDOCAINE HCL (PF) 2 % IJ SOLN
INTRAOCULAR | Status: DC | PRN
  Administered 2018-02-03: 1 mL via INTRAMUSCULAR

## 2018-02-03 MED ORDER — ARMC OPHTHALMIC DILATING DROPS
1.0000 "application " | OPHTHALMIC | Status: DC | PRN
  Administered 2018-02-03 (×3): 1 via OPHTHALMIC

## 2018-02-03 MED ORDER — FENTANYL CITRATE (PF) 100 MCG/2ML IJ SOLN
INTRAMUSCULAR | Status: DC | PRN
  Administered 2018-02-03: 50 ug via INTRAVENOUS

## 2018-02-03 MED ORDER — MIDAZOLAM HCL 2 MG/2ML IJ SOLN
INTRAMUSCULAR | Status: DC | PRN
  Administered 2018-02-03 (×2): 1 mg via INTRAVENOUS

## 2018-02-03 MED ORDER — NA HYALUR & NA CHOND-NA HYALUR 0.4-0.35 ML IO KIT
PACK | INTRAOCULAR | Status: DC | PRN
  Administered 2018-02-03: 1 mL via INTRAOCULAR

## 2018-02-03 MED ORDER — CEFUROXIME OPHTHALMIC INJECTION 1 MG/0.1 ML
INJECTION | OPHTHALMIC | Status: DC | PRN
  Administered 2018-02-03: 0.1 mL via INTRACAMERAL

## 2018-02-03 SURGICAL SUPPLY — 27 items
CANNULA ANT/CHMB 27G (MISCELLANEOUS) ×1 IMPLANT
CANNULA ANT/CHMB 27GA (MISCELLANEOUS) ×2 IMPLANT
CARTRIDGE ABBOTT (MISCELLANEOUS) IMPLANT
GLOVE SURG LX 7.5 STRW (GLOVE) ×1
GLOVE SURG LX STRL 7.5 STRW (GLOVE) ×1 IMPLANT
GLOVE SURG TRIUMPH 8.0 PF LTX (GLOVE) ×2 IMPLANT
GOWN STRL REUS W/ TWL LRG LVL3 (GOWN DISPOSABLE) ×2 IMPLANT
GOWN STRL REUS W/TWL LRG LVL3 (GOWN DISPOSABLE) ×2
MARKER SKIN DUAL TIP RULER LAB (MISCELLANEOUS) ×2 IMPLANT
NDL FILTER BLUNT 18X1 1/2 (NEEDLE) ×1 IMPLANT
NDL RETROBULBAR .5 NSTRL (NEEDLE) IMPLANT
NEEDLE FILTER BLUNT 18X 1/2SAF (NEEDLE) ×1
NEEDLE FILTER BLUNT 18X1 1/2 (NEEDLE) ×1 IMPLANT
PACK CATARACT BRASINGTON (MISCELLANEOUS) ×2 IMPLANT
PACK EYE AFTER SURG (MISCELLANEOUS) ×2 IMPLANT
PACK OPTHALMIC (MISCELLANEOUS) ×2 IMPLANT
RING MALYGIN 7.0 (MISCELLANEOUS) IMPLANT
SUT ETHILON 10-0 CS-B-6CS-B-6 (SUTURE)
SUT VICRYL  9 0 (SUTURE)
SUT VICRYL 9 0 (SUTURE) IMPLANT
SUTURE EHLN 10-0 CS-B-6CS-B-6 (SUTURE) IMPLANT
SYR 3ML LL SCALE MARK (SYRINGE) ×2 IMPLANT
SYR 5ML LL (SYRINGE) ×2 IMPLANT
SYR TB 1ML LUER SLIP (SYRINGE) ×2 IMPLANT
WATER STERILE IRR 500ML POUR (IV SOLUTION) ×2 IMPLANT
WIPE NON LINTING 3.25X3.25 (MISCELLANEOUS) ×2 IMPLANT
acrasof IQ toric lens (Intraocular Lens) ×1 IMPLANT

## 2018-02-03 NOTE — Transfer of Care (Signed)
Immediate Anesthesia Transfer of Care Note  Patient: Tabitha Rice  Procedure(s) Performed: CATARACT EXTRACTION PHACO AND INTRAOCULAR LENS PLACEMENT (IOC) LEFT TORIC LENS (Left )  Patient Location: PACU  Anesthesia Type: MAC  Level of Consciousness: awake, alert  and patient cooperative  Airway and Oxygen Therapy: Patient Spontanous Breathing and Patient connected to supplemental oxygen  Post-op Assessment: Post-op Vital signs reviewed, Patient's Cardiovascular Status Stable, Respiratory Function Stable, Patent Airway and No signs of Nausea or vomiting  Post-op Vital Signs: Reviewed and stable  Complications: No apparent anesthesia complications

## 2018-02-03 NOTE — Op Note (Signed)
LOCATION:  [facility]   PREOPERATIVE DIAGNOSIS:  Nuclear sclerotic cataract of the left eye.  H25.12  POSTOPERATIVE DIAGNOSIS:  Nuclear sclerotic cataract of the left eye.   PROCEDURE:  Phacoemulsification with Toric posterior chamber intraocular lens placement of the left eye.   LENS:  Implant Name Type Inv. Item Serial No. Manufacturer Lot No. LRB No. Used  Toric Intraocular Lens  [id number] ALCON  Left 1   [id number] 16.0 D Toric intraocular lens with 1.5 diopters of cylindrical power with axis orientation at 121 degrees.   ULTRASOUND TIME: 11 % of 1 minutes, 2 seconds.  CDE 6.8   SURGEON:  Wyonia Hough, MD   ANESTHESIA:  Topical with tetracaine drops and 2% Xylocaine jelly, augmented with 1% preservative-free intracameral lidocaine.  COMPLICATIONS:  None.   DESCRIPTION OF PROCEDURE:  The patient was identified in the holding room and transported to the operating suite and placed in the supine position under the operating microscope.  The left eye was identified as the operative eye, and it was prepped and draped in the usual sterile ophthalmic fashion.    A clear-corneal paracentesis incision was made at the 1:30 position.  0.5 ml of preservative-free 1% lidocaine was injected into the anterior chamber. The anterior chamber was filled with Viscoat.  A 2.4 millimeter near clear corneal incision was then made at the 10:30 position.  A cystotome and capsulorrhexis forceps were then used to make a curvilinear capsulorrhexis.  Hydrodissection and hydrodelineation were then performed using balanced salt solution.   Phacoemulsification was then used in stop and chop fashion to remove the lens, nucleus and epinucleus.  The remaining cortex was aspirated using the irrigation and aspiration handpiece.  Provisc viscoelastic was then placed into the capsular bag to distend it for lens placement.  The Verion digital marker was used to align the implant at the intended axis.   A  16.0 diopter lens was then injected into the capsular bag.  It was rotated clockwise until the axis marks on the lens were approximately 15 degrees in the counterclockwise direction to the intended alignment.  The viscoelastic was aspirated from the eye using the irrigation aspiration handpiece.  Then, a Koch spatula through the sideport incision was used to rotate the lens in a clockwise direction until the axis markings of the intraocular lens were lined up with the Verion alignment.  Balanced salt solution was then used to hydrate the wounds. Cefuroxime 0.1 ml of a 10mg /ml solution was injected into the anterior chamber for a dose of 1 mg of intracameral antibiotic at the completion of the case.    The eye was noted to have a physiologic pressure and there was no wound leak noted.   Timolol and Brimonidine drops were applied to the eye.  The patient was taken to the recovery room in stable condition having had no complications of anesthesia or surgery.  Harman Ferrin 02/03/2018, 1:20 PM

## 2018-02-03 NOTE — Anesthesia Procedure Notes (Signed)
Procedure Name: MAC Date/Time: 02/03/2018 1:03 PM Performed by: Lind Guest, CRNA Pre-anesthesia Checklist: Patient identified, Emergency Drugs available, Suction available, Patient being monitored and Timeout performed Patient Re-evaluated:Patient Re-evaluated prior to induction Oxygen Delivery Method: Nasal cannula

## 2018-02-03 NOTE — Anesthesia Preprocedure Evaluation (Signed)
Anesthesia Evaluation  Patient identified by MRN, date of birth, ID band Patient awake    Reviewed: Allergy & Precautions, H&P , NPO status , Patient's Chart, lab work & pertinent test results, reviewed documented beta blocker date and time   Airway Mallampati: II  TM Distance: >3 FB Neck ROM: full    Dental no notable dental hx.    Pulmonary neg pulmonary ROS,    Pulmonary exam normal breath sounds clear to auscultation       Cardiovascular Exercise Tolerance: Good negative cardio ROS Normal cardiovascular exam Rhythm:regular Rate:Normal     Neuro/Psych negative neurological ROS  negative psych ROS   GI/Hepatic Neg liver ROS, GERD  ,  Endo/Other  negative endocrine ROS  Renal/GU negative Renal ROS  negative genitourinary   Musculoskeletal   Abdominal   Peds  Hematology  (+) anemia ,   Anesthesia Other Findings   Reproductive/Obstetrics negative OB ROS                             Anesthesia Physical  Anesthesia Plan  ASA: II  Anesthesia Plan: MAC   Post-op Pain Management:    Induction:   PONV Risk Score and Plan: 2 and Midazolam and Treatment may vary due to age or medical condition  Airway Management Planned:   Additional Equipment:   Intra-op Plan:   Post-operative Plan:   Informed Consent: I have reviewed the patients History and Physical, chart, labs and discussed the procedure including the risks, benefits and alternatives for the proposed anesthesia with the patient or authorized representative who has indicated his/her understanding and acceptance.   Dental Advisory Given  Plan Discussed with: CRNA  Anesthesia Plan Comments:         Anesthesia Quick Evaluation

## 2018-02-03 NOTE — Anesthesia Postprocedure Evaluation (Signed)
Anesthesia Post Note  Patient: Tabitha Rice  Procedure(s) Performed: CATARACT EXTRACTION PHACO AND INTRAOCULAR LENS PLACEMENT (IOC) LEFT TORIC LENS (Left )  Patient location during evaluation: PACU Anesthesia Type: MAC Level of consciousness: awake and alert and oriented Pain management: satisfactory to patient Vital Signs Assessment: post-procedure vital signs reviewed and stable Respiratory status: spontaneous breathing, nonlabored ventilation and respiratory function stable Cardiovascular status: blood pressure returned to baseline and stable Postop Assessment: Adequate PO intake and No signs of nausea or vomiting Anesthetic complications: no    Raliegh Ip

## 2018-02-03 NOTE — H&P (Signed)
The History and Physical notes are on paper, have been signed, and are to be scanned. The patient remains stable and unchanged from the H&P.   Previous H&P reviewed, patient examined, and there are no changes.  Tabitha Rice 02/03/2018 11:57 AM

## 2018-02-04 ENCOUNTER — Encounter: Payer: Self-pay | Admitting: Ophthalmology

## 2018-03-17 ENCOUNTER — Other Ambulatory Visit: Payer: Self-pay | Admitting: Internal Medicine

## 2018-03-17 DIAGNOSIS — Z1231 Encounter for screening mammogram for malignant neoplasm of breast: Secondary | ICD-10-CM

## 2018-04-06 ENCOUNTER — Ambulatory Visit
Admission: RE | Admit: 2018-04-06 | Discharge: 2018-04-06 | Disposition: A | Discharge status: Home / Self Care | Payer: Medicare Other | Source: Ambulatory Visit | Attending: Internal Medicine | Admitting: Internal Medicine

## 2018-04-06 DIAGNOSIS — Z1231 Encounter for screening mammogram for malignant neoplasm of breast: Secondary | ICD-10-CM

## 2018-04-20 ENCOUNTER — Other Ambulatory Visit: Payer: Self-pay | Admitting: Urgent Care

## 2018-04-20 NOTE — Telephone Encounter (Signed)
  I agree.  If we are not following, then tamoxifen should be written by the physician managing the medication.   M

## 2018-04-20 NOTE — Telephone Encounter (Signed)
She is only PRN follow up with [place] at this point. She wanted to be managed by her primary. Shouldn't PCP take over this Rx?

## 2018-09-19 IMAGING — MG MM PLC BREAST LOC DEV 1ST LESION INC*R*
6 series · 6 of 6 positions shown · non-contrast
Comparison: Previous exams.

CLINICAL DATA: 71-year-old female presenting for while localization
prior to excisional right breast biopsy for radial scars.

EXAM:
NEEDLE LOCALIZATION OF THE RIGHT BREAST WITH MAMMO GUIDANCE

[R CC (1 of 4)]
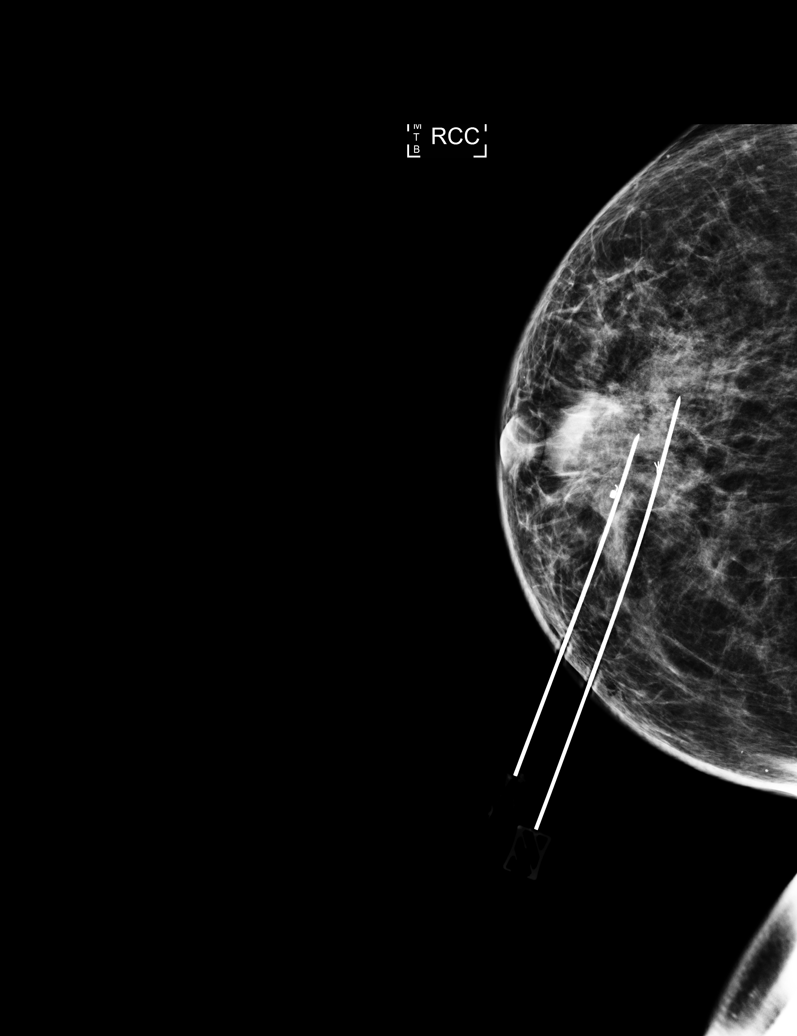

[R CC (2 of 4)]
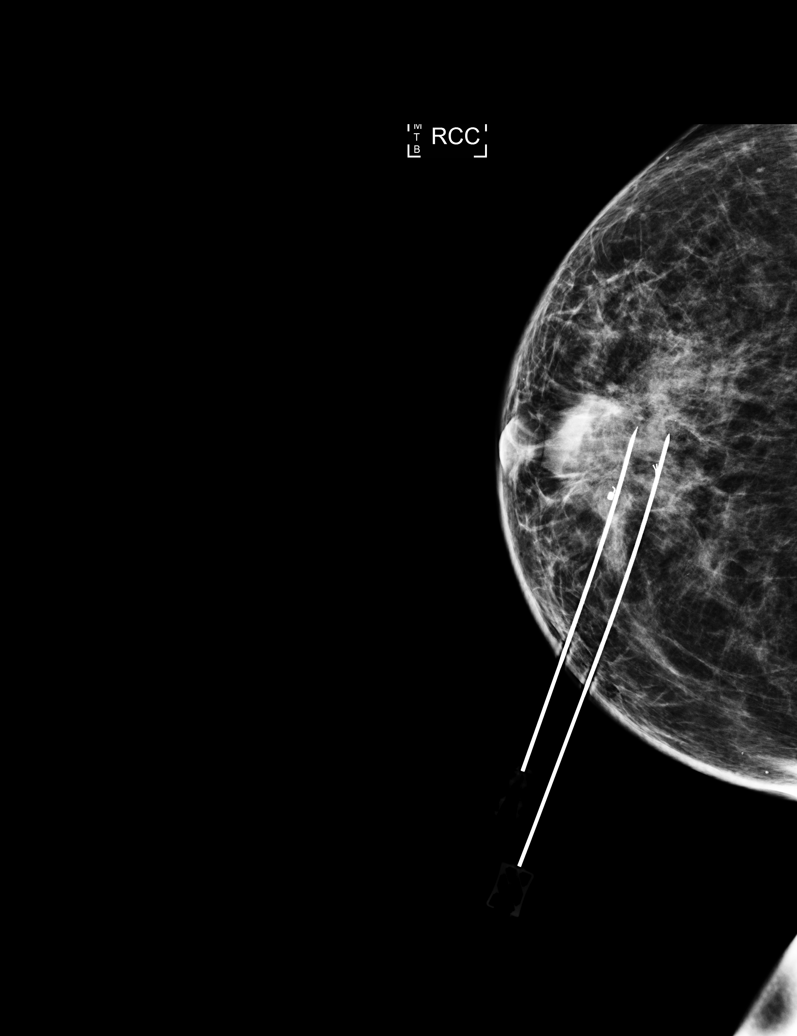

[R ML (1 of 2)]
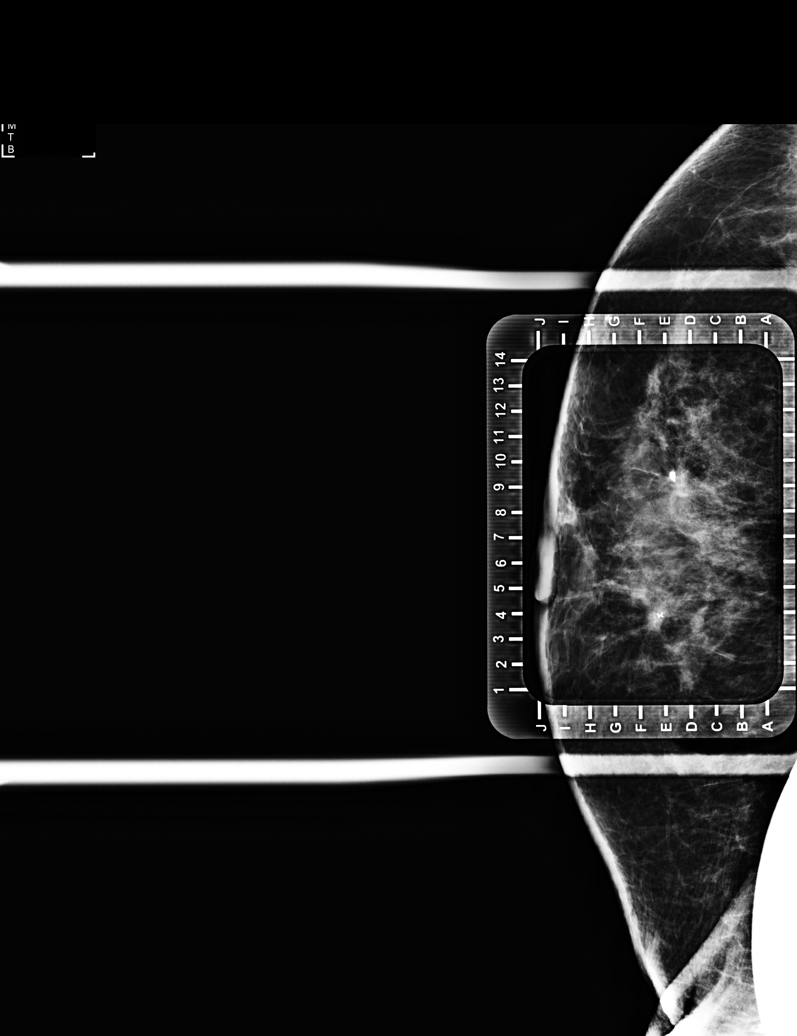

[R CC (3 of 4)]
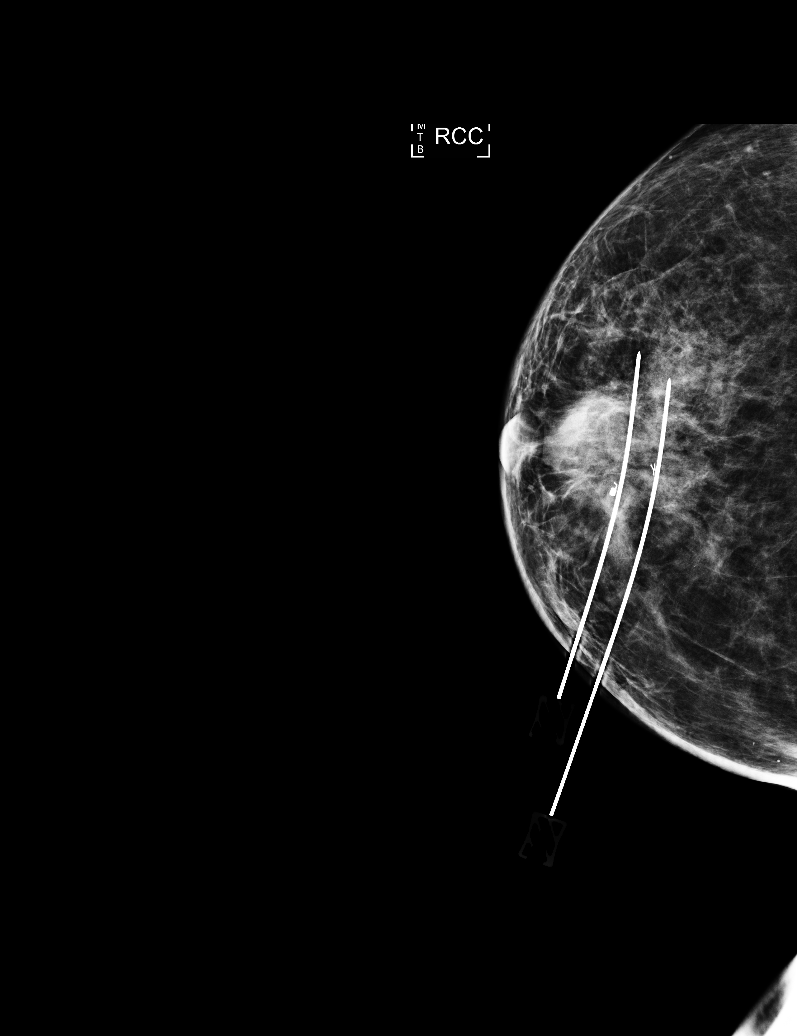

[R CC (4 of 4)]
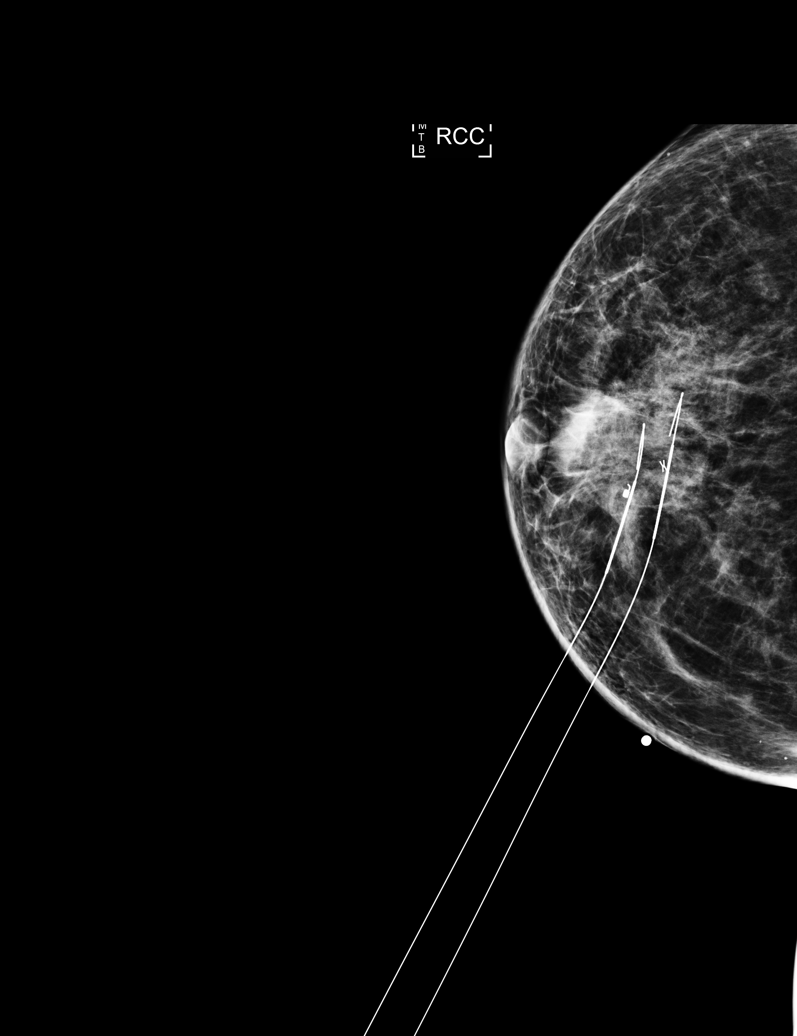

[R ML (2 of 2)]
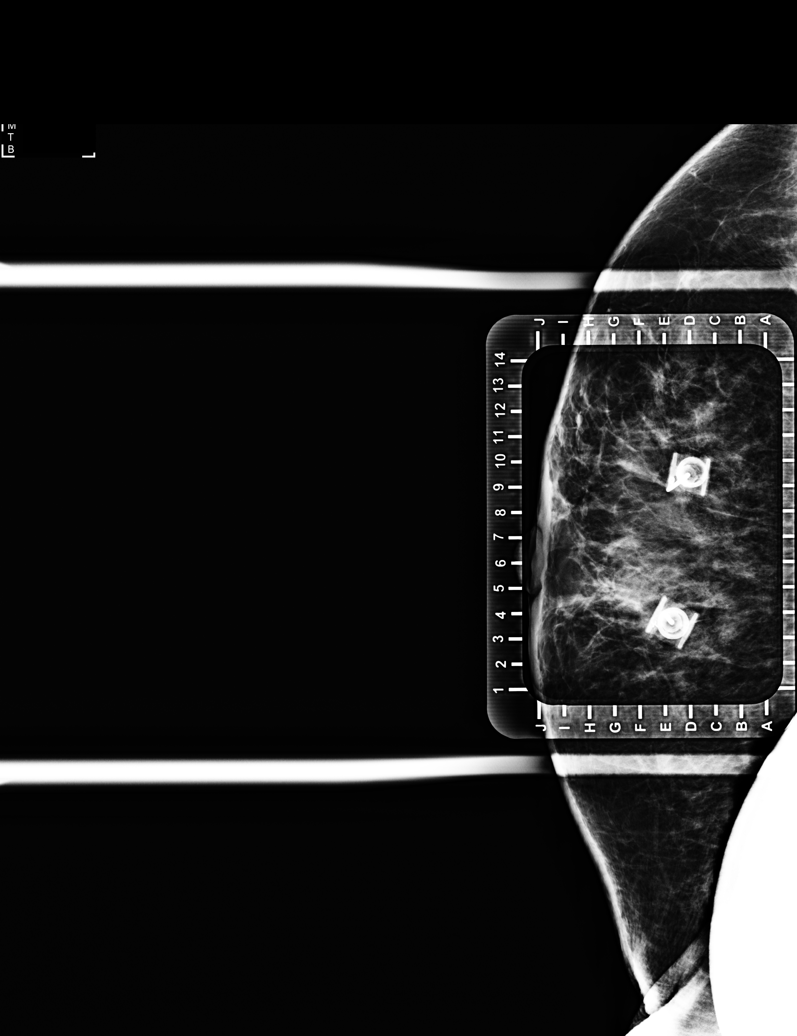

[6 of 6 positions shown; findings below may reference images not displayed]

FINDINGS: Patient presents for needle localization prior to excisional biopsy
of the right breast. I met with the patient and we discussed the
procedure of needle localization including benefits and
alternatives. We discussed the high likelihood of a successful
procedure. We discussed the risks of the procedure, including
infection, bleeding, tissue injury, and further surgery. Informed,
written consent was given. The usual time-out protocol was performed
immediately prior to the procedure.

Using mammographic guidance, sterile technique, 1% lidocaine and a 7
cm modified Kopans needle, the coil shaped biopsy marking clip was
localized using a medial approach.

Using mammographic guidance, sterile technique, 1% lidocaine and a 9
cm modified Kopans needle, the X shaped biopsy marking clip was
localized using a medial approach. The images were marked for Dr.
Benrabah.
IMPRESSION: Needle localization of 2 sites in the right breast. No apparent
complications.

## 2019-03-21 ENCOUNTER — Other Ambulatory Visit: Payer: Self-pay | Admitting: Internal Medicine

## 2019-03-21 DIAGNOSIS — Z1231 Encounter for screening mammogram for malignant neoplasm of breast: Secondary | ICD-10-CM

## 2019-04-11 ENCOUNTER — Encounter (INDEPENDENT_AMBULATORY_CARE_PROVIDER_SITE_OTHER): Payer: Self-pay

## 2019-04-11 ENCOUNTER — Ambulatory Visit
Admission: RE | Admit: 2019-04-11 | Discharge: 2019-04-11 | Disposition: A | Discharge status: Home / Self Care | Payer: Medicare Other | Source: Ambulatory Visit | Attending: Internal Medicine | Admitting: Internal Medicine

## 2019-04-11 ENCOUNTER — Other Ambulatory Visit: Payer: Self-pay

## 2019-04-11 DIAGNOSIS — Z1231 Encounter for screening mammogram for malignant neoplasm of breast: Secondary | ICD-10-CM | POA: Insufficient documentation

## 2020-03-23 ENCOUNTER — Other Ambulatory Visit: Payer: Self-pay | Admitting: Internal Medicine

## 2020-03-23 DIAGNOSIS — Z1231 Encounter for screening mammogram for malignant neoplasm of breast: Secondary | ICD-10-CM

## 2020-05-01 ENCOUNTER — Ambulatory Visit
Admission: RE | Admit: 2020-05-01 | Discharge: 2020-05-01 | Disposition: A | Discharge status: Home / Self Care | Payer: Medicare Other | Source: Ambulatory Visit | Attending: Internal Medicine | Admitting: Internal Medicine

## 2020-05-01 ENCOUNTER — Other Ambulatory Visit: Payer: Self-pay

## 2020-05-01 DIAGNOSIS — Z1231 Encounter for screening mammogram for malignant neoplasm of breast: Secondary | ICD-10-CM

## 2021-03-25 ENCOUNTER — Other Ambulatory Visit: Payer: Self-pay | Admitting: Internal Medicine

## 2021-03-25 DIAGNOSIS — Z1231 Encounter for screening mammogram for malignant neoplasm of breast: Secondary | ICD-10-CM

## 2021-05-02 ENCOUNTER — Other Ambulatory Visit: Payer: Self-pay

## 2021-05-02 ENCOUNTER — Ambulatory Visit
Admission: RE | Admit: 2021-05-02 | Discharge: 2021-05-02 | Disposition: A | Discharge status: Home / Self Care | Payer: Medicare Other | Source: Ambulatory Visit | Attending: Internal Medicine | Admitting: Internal Medicine

## 2021-05-02 DIAGNOSIS — Z1231 Encounter for screening mammogram for malignant neoplasm of breast: Secondary | ICD-10-CM | POA: Diagnosis present

## 2021-08-30 ENCOUNTER — Encounter: Payer: Self-pay | Admitting: Gastroenterology

## 2021-09-01 ENCOUNTER — Encounter: Payer: Self-pay | Admitting: Gastroenterology

## 2021-09-01 NOTE — H&P (Signed)
? ?Pre-Procedure H&P ?  ?Patient ID: Tabitha Rice is a 76 y.o. female. ? ?Gastroenterology Provider: Annamaria Helling, DO ? ?Referring Provider: Dawson Bills, NP ?PCP: Idelle Crouch, MD ? ?Date: 09/02/2021 ? ?HPI ?Ms. Tabitha Rice is a 76 y.o. female who presents today for Esophagogastroduodenoscopy and Colonoscopy for chronic iron deficiency anemia and fecal occult blood testing positive.  Personal history of colon polyps ? ?Patient with longstanding history of iron deficiency anemia.  Hemoglobin has tendency to decrease as patient does not always take her iron tablets.  When she begins taking them again for hemoglobin responds appropriately. ? ?She has a personal history of polyps.  Last colonoscopy 2019 demonstrated 1 small sessile serrated polyp ?Also underwent EGD in 2019 demonstrating normal esophagus with surgical deformity of the antrum as she is status post Whipple surgery for pancreatic cystadenoma. ?Colonoscopy 2014 diverticulosis and internal hemorrhoids ?Colonoscopy 2009 5 polyps including a large tubulovillous adenoma with high-grade dysplasia.  This was repeated after polyp resection in November 2009 but no further demonstration of the polyp ? ?Father with history of stomach cancer in his 75s.  No family history of colon polyps or colon cancer ? ?Most recent lab values hemoglobin 11.7 uptrending MCV 88.6 platelets 258,000 iron sat 12% iron 39 TIBC 331 ? ? ?Past Medical History:  ?Diagnosis Date  ? Allergic rhinitis 02/12/2015  ? Anemia   ? Arthritis   ? osteoarthritis  ? Arthritis, degenerative 02/12/2015  ? Basal cell carcinoma of cheek 10/25/2012  ? Overview:  Basal cell carcinoma of left medial cheek treated with Mohs surgery on 10/25/2012.   ? Basal cell carcinoma of face 01/28/2012  ? Overview:  BCC of left medial cheek treated with Mohs surgery 01-28-12   ? CA skin, basal cell 02/12/2015  ? Climacteric 02/12/2015  ? DDD (degenerative disc disease), cervical   ? DDD (degenerative disc disease),  cervical   ? Edema, peripheral 02/12/2015  ? Female genuine stress incontinence 02/12/2015  ? GERD (gastroesophageal reflux disease)   ? Heart murmur   ? HLD (hyperlipidemia) 02/12/2015  ? Hyperlipidemia   ? Injury of eyeball 02/12/2015  ? Overview:  LEFT RETINAL TRAUMA   ? Inverted nipple   ? left nipple inverted, nothing new  ? Osteopenia 02/12/2015  ? Pancreatic cystadenoma   ? Skin cancer, basal cell   ? Stasis, venous 02/12/2015  ? Wears contact lenses   ? ? ?Past Surgical History:  ?Procedure Laterality Date  ? ABDOMINAL HYSTERECTOMY  1982  ? APPENDECTOMY    ? BREAST BIOPSY Left 11/23/2014  ? Procedure: BREAST BIOPSY WITH NEEDLE LOCALIZATION;  Surgeon: Leonie Green, MD;  Location: [facility];  Service: General;  Laterality: Left;  ? BREAST BIOPSY Left 09/25/2014  ? complex sclerosing lesion  ? BREAST BIOPSY Right 01/13/2017  ? 2 area/radial scar  ? BREAST EXCISIONAL BIOPSY Right 02/20/2017  ? radial scar  ? BREAST LUMPECTOMY Left 11/23/2014  ? complex sclerosing lesion excised.  ? BREAST LUMPECTOMY Right 02/20/2017  ? radial scars  ? BREAST LUMPECTOMY WITH NEEDLE LOCALIZATION Right 02/20/2017  ? Procedure: EXCISION OF RIGHT BREAST MASS WITH NEEDLE LOCALIZATION;  Surgeon: Leonie Green, MD;  Location: [facility];  Service: General;  Laterality: Right;  ? CATARACT EXTRACTION W/PHACO Right 01/12/2018  ? Procedure: CATARACT EXTRACTION PHACO AND INTRAOCULAR LENS PLACEMENT (IOC)  RIGHT TORIC LENS ? MAYBE;  Surgeon: Leandrew Koyanagi, MD;  Location: [place];  Service: Ophthalmology;  Laterality: Right;  ? CATARACT EXTRACTION  W/PHACO Left 02/03/2018  ? Procedure: CATARACT EXTRACTION PHACO AND INTRAOCULAR LENS PLACEMENT ([place]) LEFT TORIC LENS;  Surgeon: Leandrew Koyanagi, MD;  Location: [place];  Service: Ophthalmology;  Laterality: Left;  ? CHOLECYSTECTOMY    ? colonoscopy with polypectomy    ? COLONOSCOPY WITH PROPOFOL N/A 08/31/2017  ? Procedure: COLONOSCOPY WITH PROPOFOL;  Surgeon:  Manya Silvas, MD;  Location: [facility];  Service: Endoscopy;  Laterality: N/A;  ? DILATION AND CURETTAGE OF UTERUS    ? ESOPHAGOGASTRODUODENOSCOPY (EGD) WITH PROPOFOL N/A 06/12/2017  ? Procedure: ESOPHAGOGASTRODUODENOSCOPY (EGD) WITH PROPOFOL;  Surgeon: Manya Silvas, MD;  Location: [facility] ENDOSCOPY;  Service: Endoscopy;  Laterality: N/A;  ? EYE SURGERY    ? Partial hystrectomy  1982  ? SHOULDER ARTHROSCOPY WITH OPEN ROTATOR CUFF REPAIR Left 06/26/2017  ? Procedure: SHOULDER ARTHROSCOPY WITH OPEN ROTATOR CUFF REPAIR;  Surgeon: Leim Fabry, MD;  Location: [facility];  Service: Orthopedics;  Laterality: Left;  ? WHIPPLE PROCEDURE  2017  ? thought pt had a slow growing pancreatic mass but it came back benign  ? ? ?Family History ?Father gastric cancer in his 80s ?No h/o GI disease or malignancy ? ?Review of Systems  ?Constitutional:  Negative for activity change, appetite change, chills, diaphoresis, fatigue, fever and unexpected weight change.  ?HENT:  Negative for trouble swallowing and voice change.   ?Respiratory:  Negative for shortness of breath and wheezing.   ?Cardiovascular:  Negative for chest pain, palpitations and leg swelling.  ?Gastrointestinal:  Negative for abdominal distention, abdominal pain, anal bleeding, blood in stool, constipation, diarrhea, nausea, rectal pain and vomiting.  ?Musculoskeletal:  Negative for arthralgias and myalgias.  ?Skin:  Negative for color change and pallor.  ?Neurological:  Negative for dizziness, syncope and weakness.  ?Psychiatric/Behavioral:  Negative for confusion.   ?All other systems reviewed and are negative.  ? ?Medications ?No current facility-administered medications on file prior to encounter.  ? ?Current Outpatient Medications on File Prior to Encounter  ?Medication Sig Dispense Refill  ? atorvastatin (LIPITOR) 10 MG tablet Take 10 mg by mouth at bedtime.     ? Cholecalciferol (VITAMIN D3) 5000 units CAPS Take 5,000 Units by mouth daily.    ?  Cyanocobalamin (VITAMIN B12) 3000 MCG SUBL Place 3,000 Units under the tongue daily.    ? Multiple Vitamin (MULTI-VITAMINS) TABS Take 1 tablet by mouth daily.    ? oxybutynin (DITROPAN-XL) 5 MG 24 hr tablet Take 5 mg by mouth at bedtime.    ? tamoxifen (NOLVADEX) 20 MG tablet Take 1 tablet (20 mg total) by mouth daily. 90 tablet 3  ? aspirin 81 MG chewable tablet Chew 81 mg by mouth daily.    ? CRANBERRY PO Take by mouth daily. (Patient not taking: Reported on 09/02/2021)    ? tolterodine (DETROL LA) 4 MG 24 hr capsule Take 4 mg by mouth daily. (Patient not taking: Reported on 09/02/2021)    ? ? ?Pertinent medications related to GI and procedure were reviewed by me with the patient prior to the procedure ? ? ?Current Facility-Administered Medications:  ?  0.9 %  sodium chloride infusion, , Intravenous, Continuous, Annamaria Helling, DO, Last Rate: 20 mL/hr at 09/02/21 1212, New Bag at 09/02/21 1212 ?  ?  ? ?Allergies  ?Allergen Reactions  ? Ambien [Zolpidem] Itching and Rash  ? Penicillins Itching and Rash  ?  Has patient had a PCN reaction causing immediate rash, facial/tongue/throat swelling, SOB or lightheadedness with hypotension: No ?Has patient had  a PCN reaction causing severe rash involving mucus membranes or skin necrosis: No ?Has patient had a PCN reaction that required hospitalization: No ?Has patient had a PCN reaction occurring within the last 10 years: No ?If all of the above answers are "NO", then may proceed with Cephalosporin use. ?  ? ?Allergies were reviewed by me prior to the procedure ? ?Objective  ? ?Body mass index is 23.74 kg/m?. ?Vitals:  ? 09/02/21 1159  ?BP: (!) 148/81  ?Pulse: 81  ?Resp: 20  ?Temp: 97.7 ?F (36.5 ?C)  ?TempSrc: Temporal  ?SpO2: 100%  ?Weight: 60.8 kg  ?Height: '5\' 3"'$  (1.6 m)  ? ? ? ?Physical Exam ?Vitals and nursing note reviewed.  ?Constitutional:   ?   General: She is not in acute distress. ?   Appearance: Normal appearance. She is not ill-appearing, toxic-appearing or  diaphoretic.  ?HENT:  ?   Head: Normocephalic and atraumatic.  ?   Nose: Nose normal.  ?   Mouth/Throat:  ?   Mouth: Mucous membranes are moist.  ?   Pharynx: Oropharynx is clear.  ?Eyes:  ?   General: No sclera

## 2021-09-02 ENCOUNTER — Ambulatory Visit: Payer: Medicare Other | Admitting: Anesthesiology

## 2021-09-02 ENCOUNTER — Encounter
Admission: RE | Disposition: A | Discharge status: Home / Self Care | Payer: Self-pay | Source: Ambulatory Visit | Attending: Gastroenterology

## 2021-09-02 ENCOUNTER — Ambulatory Visit
Admission: RE | Admit: 2021-09-02 | Discharge: 2021-09-02 | Disposition: A | Discharge status: Home / Self Care | Payer: Medicare Other | Source: Ambulatory Visit | Attending: Gastroenterology | Admitting: Gastroenterology

## 2021-09-02 ENCOUNTER — Encounter: Payer: Self-pay | Admitting: Gastroenterology

## 2021-09-02 ENCOUNTER — Other Ambulatory Visit: Payer: Self-pay

## 2021-09-02 DIAGNOSIS — D509 Iron deficiency anemia, unspecified: Secondary | ICD-10-CM | POA: Insufficient documentation

## 2021-09-02 DIAGNOSIS — K219 Gastro-esophageal reflux disease without esophagitis: Secondary | ICD-10-CM | POA: Diagnosis not present

## 2021-09-02 DIAGNOSIS — D124 Benign neoplasm of descending colon: Secondary | ICD-10-CM | POA: Diagnosis not present

## 2021-09-02 DIAGNOSIS — K640 First degree hemorrhoids: Secondary | ICD-10-CM | POA: Diagnosis not present

## 2021-09-02 DIAGNOSIS — Z1211 Encounter for screening for malignant neoplasm of colon: Secondary | ICD-10-CM | POA: Diagnosis present

## 2021-09-02 DIAGNOSIS — Z9049 Acquired absence of other specified parts of digestive tract: Secondary | ICD-10-CM | POA: Insufficient documentation

## 2021-09-02 DIAGNOSIS — Z86010 Personal history of colonic polyps: Secondary | ICD-10-CM | POA: Diagnosis not present

## 2021-09-02 DIAGNOSIS — R195 Other fecal abnormalities: Secondary | ICD-10-CM | POA: Insufficient documentation

## 2021-09-02 DIAGNOSIS — Z800 Family history of malignant neoplasm of digestive organs: Secondary | ICD-10-CM | POA: Diagnosis not present

## 2021-09-02 DIAGNOSIS — K2289 Other specified disease of esophagus: Secondary | ICD-10-CM | POA: Insufficient documentation

## 2021-09-02 DIAGNOSIS — Z90411 Acquired partial absence of pancreas: Secondary | ICD-10-CM | POA: Diagnosis not present

## 2021-09-02 HISTORY — PX: ESOPHAGOGASTRODUODENOSCOPY: SHX5428

## 2021-09-02 HISTORY — PX: COLONOSCOPY WITH PROPOFOL: SHX5780

## 2021-09-02 SURGERY — COLONOSCOPY WITH PROPOFOL
Anesthesia: General

## 2021-09-02 MED ORDER — PROPOFOL 500 MG/50ML IV EMUL
INTRAVENOUS | Status: DC | PRN
  Administered 2021-09-02: 140 ug/kg/min via INTRAVENOUS

## 2021-09-02 MED ORDER — PROPOFOL 10 MG/ML IV BOLUS
INTRAVENOUS | Status: DC | PRN
  Administered 2021-09-02: 70 mg via INTRAVENOUS

## 2021-09-02 MED ORDER — LIDOCAINE HCL (CARDIAC) PF 100 MG/5ML IV SOSY
PREFILLED_SYRINGE | INTRAVENOUS | Status: DC | PRN
  Administered 2021-09-02: 60 mg via INTRAVENOUS

## 2021-09-02 MED ORDER — STERILE WATER FOR IRRIGATION IR SOLN
Status: DC | PRN
  Administered 2021-09-02: 50 mL

## 2021-09-02 MED ORDER — SODIUM CHLORIDE 0.9 % IV SOLN: INTRAVENOUS | Status: DC

## 2021-09-02 NOTE — Anesthesia Preprocedure Evaluation (Signed)
Anesthesia Evaluation  ?Patient identified by MRN, date of birth, ID band ?Patient awake ? ? ? ?Reviewed: ?Allergy & Precautions, NPO status , Patient's Chart, lab work & pertinent test results ? ?History of Anesthesia Complications ?Negative for: history of anesthetic complications ? ?Airway ?Mallampati: III ? ?TM Distance: <3 FB ?Neck ROM: full ? ? ? Dental ? ?(+) Chipped ?  ?Pulmonary ?neg pulmonary ROS, neg shortness of breath,  ?  ?Pulmonary exam normal ? ? ? ? ? ? ? Cardiovascular ?Exercise Tolerance: Good ?(-) angina(-) Past MI negative cardio ROS ?Normal cardiovascular exam ? ? ?  ?Neuro/Psych ?negative neurological ROS ? negative psych ROS  ? GI/Hepatic ?Neg liver ROS, GERD  Controlled,  ?Endo/Other  ?negative endocrine ROS ? Renal/GU ?negative Renal ROS  ?negative genitourinary ?  ?Musculoskeletal ? ? Abdominal ?  ?Peds ? Hematology ?negative hematology ROS ?(+)   ?Anesthesia Other Findings ?Hoarse voiced at baseline ? ?Past Medical History: ?02/12/2015: Allergic rhinitis ?No date: Anemia ?No date: Arthritis ?    Comment:  osteoarthritis ?02/12/2015: Arthritis, degenerative ?10/25/2012: Basal cell carcinoma of cheek ?    Comment:  Overview:  Basal cell carcinoma of left medial cheek  ?             treated with Mohs surgery on 10/25/2012.  ?01/28/2012: Basal cell carcinoma of face ?    Comment:  Overview:  BCC of left medial cheek treated with Mohs  ?             surgery 01-28-12  ?02/12/2015: CA skin, basal cell ?02/12/2015: Climacteric ?No date: DDD (degenerative disc disease), cervical ?No date: DDD (degenerative disc disease), cervical ?02/12/2015: Edema, peripheral ?02/12/2015: Female genuine stress incontinence ?No date: GERD (gastroesophageal reflux disease) ?No date: Heart murmur ?02/12/2015: HLD (hyperlipidemia) ?No date: Hyperlipidemia ?02/12/2015: Injury of eyeball ?    Comment:  Overview:  LEFT RETINAL TRAUMA  ?No date: Inverted nipple ?    Comment:  left nipple inverted,  nothing new ?02/12/2015: Osteopenia ?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 ?No date: Skin cancer, basal cell ?02/12/2015: Stasis, venous ?No date: Wears contact lenses ? ?Past Surgical History: ?1982: ABDOMINAL HYSTERECTOMY ?No date: APPENDECTOMY ?11/23/2014: BREAST BIOPSY; Left ?    Comment:  Procedure: BREAST BIOPSY WITH NEEDLE LOCALIZATION;   ?             Surgeon: Leonie Green, MD;  Location: [facility];   ?             Service: General;  Laterality: Left; ?09/25/2014: BREAST BIOPSY; Left ?    Comment:  complex sclerosing lesion ?01/13/2017: BREAST BIOPSY; Right ?    Comment:  2 area/radial scar ?02/20/2017: BREAST EXCISIONAL BIOPSY; Right ?    Comment:  radial scar ?11/23/2014: BREAST LUMPECTOMY; Left ?    Comment:  complex sclerosing lesion excised. ?02/20/2017: BREAST LUMPECTOMY; Right ?    Comment:  radial scars ?02/20/2017: BREAST LUMPECTOMY WITH NEEDLE LOCALIZATION; Right ?    Comment:  Procedure: EXCISION OF RIGHT BREAST MASS WITH NEEDLE  ?             LOCALIZATION;  Surgeon: Leonie Green, MD;   ?             Location: [facility];  Service: General;  Laterality:  ?             Right; ?01/12/2018: CATARACT EXTRACTION W/PHACO; Right ?    Comment:  Procedure: CATARACT EXTRACTION PHACO AND INTRAOCULAR  ?  LENS PLACEMENT (IOC)  RIGHT TORIC LENS ? MAYBE;  Surgeon: ?             Leandrew Koyanagi, MD;  Location: [place]; ?             Service: Ophthalmology;  Laterality: Right; ?02/03/2018: CATARACT EXTRACTION W/PHACO; Left ?    Comment:  Procedure: CATARACT EXTRACTION PHACO AND INTRAOCULAR  ?             LENS PLACEMENT ([place]) LEFT TORIC LENS;  Surgeon:  ?             Leandrew Koyanagi, MD;  Location: [place]; ?             Service: Ophthalmology;  Laterality: Left; ?No date: CHOLECYSTECTOMY ?No date: colonoscopy with polypectomy ?08/31/2017: COLONOSCOPY WITH PROPOFOL; N/A ?    Comment:  Procedure: COLONOSCOPY WITH PROPOFOL;  Surgeon: Vira Agar, ?             Gavin Pound,  MD;  Location: [facility];  Service:  ?             Endoscopy;  Laterality: N/A; ?No date: DILATION AND CURETTAGE OF UTERUS ?06/12/2017: ESOPHAGOGASTRODUODENOSCOPY (EGD) WITH PROPOFOL; N/A ?    Comment:  Procedure: ESOPHAGOGASTRODUODENOSCOPY (EGD) WITH  ?             PROPOFOL;  Surgeon: Manya Silvas, MD;  Location:  ?             [place] [facility]OSCOPY;  Service: Endoscopy;  Laterality: N/A; ?No date: EYE SURGERY ?1982: Partial hystrectomy ?06/26/2017: SHOULDER ARTHROSCOPY WITH OPEN ROTATOR CUFF REPAIR; Left ?    Comment:  Procedure: SHOULDER ARTHROSCOPY WITH OPEN ROTATOR CUFF  ?             REPAIR;  Surgeon: Leim Fabry, MD;  Location: [facility];  ?             Service: Orthopedics;  Laterality: Left; ?2017: WHIPPLE PROCEDURE ?    Comment:  thought pt had a slow growing pancreatic mass but it  ?             came back benign ? ?BMI   ? Body Mass Index: 23.74 kg/m?  ?  ? ? Reproductive/Obstetrics ?negative OB ROS ? ?  ? ? ? ? ? ? ? ? ? ? ? ? ? ?  ?  ? ? ? ? ? ? ? ? ?Anesthesia Physical ?Anesthesia Plan ? ?ASA: 3 ? ?Anesthesia Plan: General  ? ?Post-op Pain Management:   ? ?Induction: Intravenous ? ?PONV Risk Score and Plan: Propofol infusion and TIVA ? ?Airway Management Planned: Natural Airway and Nasal Cannula ? ?Additional Equipment:  ? ?Intra-op Plan:  ? ?Post-operative Plan:  ? ?Informed Consent: I have reviewed the patients History and Physical, chart, labs and discussed the procedure including the risks, benefits and alternatives for the proposed anesthesia with the patient or authorized representative who has indicated his/her understanding and acceptance.  ? ? ? ?Dental Advisory Given ? ?Plan Discussed with: Anesthesiologist, CRNA and Surgeon ? ?Anesthesia Plan Comments: (Patient consented for risks of anesthesia including but not limited to:  ?- adverse reactions to medications ?- risk of airway placement if required ?- damage to eyes, teeth, lips or other oral mucosa ?- nerve damage due to positioning  ?-  sore throat or hoarseness ?- Damage to heart, brain, nerves, lungs, other parts of body or loss of life ? ?Patient voiced understanding.)  ? ? ? ? ? ? ?  Anesthesia Quick Evaluation ? ?

## 2021-09-02 NOTE — Op Note (Addendum)
[facility] ?Gastroenterology ?Patient Name: Tabitha Rice ?Procedure Date: 09/02/2021 1:45 PM ?MRN: [id number] ?Account #: [id number] ?Date of Birth: [birth year] ?Admit Type: Outpatient ?Age: 76 ?Room: [facility] ENDO ROOM 2 ?Gender: Female ?Note Status: Finalized ?Instrument Name: Upper Endoscope [id number] ?Procedure:             Upper GI endoscopy ?Indications:           Iron deficiency anemia ?Providers:             Annamaria Helling DO, DO ?Medicines:             Monitored Anesthesia Care ?Complications:         No immediate complications. Estimated blood loss:  ?                       Minimal. ?Procedure:             Pre-Anesthesia Assessment: ?                       - Prior to the procedure, a History and Physical was  ?                       performed, and patient medications and allergies were  ?                       reviewed. The patient is competent. The risks and  ?                       benefits of the procedure and the sedation options and  ?                       risks were discussed with the patient. All questions  ?                       were answered and informed consent was obtained.  ?                       Patient identification and proposed procedure were  ?                       verified by the physician, the nurse, the anesthetist  ?                       and the technician in the endoscopy suite. Mental  ?                       Status Examination: alert and oriented. Airway  ?                       Examination: normal oropharyngeal airway and neck  ?                       mobility. Respiratory Examination: clear to  ?                       auscultation. CV Examination: RRR, no murmurs, no S3  ?                       or S4. Prophylactic Antibiotics: The patient does  not  ?                       require prophylactic antibiotics. Prior  ?                       Anticoagulants: The patient has taken no previous  ?                       anticoagulant or antiplatelet agents. ASA Grade  ?                        Assessment: III - A patient with severe systemic  ?                       disease. After reviewing the risks and benefits, the  ?                       patient was deemed in satisfactory condition to  ?                       undergo the procedure. The anesthesia plan was to use  ?                       monitored anesthesia care (MAC). Immediately prior to  ?                       administration of medications, the patient was  ?                       re-assessed for adequacy to receive sedatives. The  ?                       heart rate, respiratory rate, oxygen saturations,  ?                       blood pressure, adequacy of pulmonary ventilation, and  ?                       response to care were monitored throughout the  ?                       procedure. The physical status of the patient was  ?                       re-assessed after the procedure. ?                       After obtaining informed consent, the endoscope was  ?                       passed under direct vision. Throughout the procedure,  ?                       the patient's blood pressure, pulse, and oxygen  ?                       saturations were monitored continuously. The Endoscope  ?  was introduced through the mouth, and advanced to the  ?                       second part of duodenum. The upper GI endoscopy was  ?                       accomplished without difficulty. The patient tolerated  ?                       the procedure well. ?Findings: ?     The Z-line was irregular. island of salmon mucosa. Biopsies were taken  ?     with a cold forceps for histology. Estimated blood loss was minimal. ?     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 This was  ?     characterized by edema, erosion, erythema and friable mucosa. Gastritis  ?     noted with hematin product. Anastamosis patent. Small bowel appears  ?     normal, healthy. Estimated blood loss: none. ?     The exam of the  esophagus was otherwise normal. ?Impression:            - Z-line irregular. Biopsied. ?                       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                       friable mucosa, edema, erosion and erythema. ?Recommendation:        - Discharge patient to home. ?                       - Resume previous diet. ?                       - Continue present medications. ?                       - avoid nsaids indefinitely. ?                       continue iron supplementation ?                       recommend initiating proton pump inhibitor capsule,  ?                       twice daily. Break capsule open and mix with apple  ?                       sauce or like to administer. ?                       - Await pathology results. ?                       - Return to GI clinic as previously scheduled. ?                       - The findings and recommendations were discussed with  ?  the patient. ?Procedure Code(s):     --- Professional --- ?                       [phone], Esophagogastroduodenoscopy, flexible,  ?                       transoral; with biopsy, single or multiple ?Diagnosis Code(s):     --- Professional --- ?                       K22.8, Other specified diseases of esophagus ?                       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                       Z90.49, Acquired absence of other specified parts of  ?                       digestive tract ?                       D50.9, Iron deficiency anemia, unspecified ?CPT copyright 2019 [employer]. All rights reserved. ?The codes documented in this report are preliminary and upon coder review may  ?be revised to meet current compliance requirements. ?Attending Participation: ?     I personally performed the entire procedure. ?Volney American, DO ?Annamaria Helling DO, DO ?09/02/2021 1:49:24 PM ?This report has been signed electronically. ?Number of Addenda: 0 ?Note Initiated On: 09/02/2021 1:45  PM ?Estimated Blood Loss:  Estimated blood loss was minimal. ?     [facility] ?

## 2021-09-02 NOTE — Op Note (Signed)
[facility] ?Gastroenterology ?Patient Name: Tabitha Rice ?Procedure Date: 09/02/2021 1:02 PM ?MRN: [id number] ?Account #: [id number] ?Date of Birth: [birth year] ?Admit Type: Outpatient ?Age: 76 ?Room: [facility] ENDO ROOM 2 ?Gender: Female ?Note Status: Finalized ?Instrument Name: Colonoscope [id number] ?Procedure:             Colonoscopy ?Indications:           High risk colon cancer surveillance: Personal history  ?                       of colonic polyps ?Providers:             Annamaria Helling DO, DO ?Medicines:             Monitored Anesthesia Care ?Complications:         No immediate complications. Estimated blood loss:  ?                       Minimal. ?Procedure:             Pre-Anesthesia Assessment: ?                       - Prior to the procedure, a History and Physical was  ?                       performed, and patient medications and allergies were  ?                       reviewed. The patient is competent. The risks and  ?                       benefits of the procedure and the sedation options and  ?                       risks were discussed with the patient. All questions  ?                       were answered and informed consent was obtained.  ?                       Patient identification and proposed procedure were  ?                       verified by the physician, the nurse, the anesthetist  ?                       and the technician in the endoscopy suite. Mental  ?                       Status Examination: alert and oriented. Airway  ?                       Examination: normal oropharyngeal airway and neck  ?                       mobility. Respiratory Examination: clear to  ?                       auscultation. CV Examination: RRR, no murmurs, no S3  ?  or S4. Prophylactic Antibiotics: The patient does not  ?                       require prophylactic antibiotics. Prior  ?                       Anticoagulants: The patient has taken no previous  ?                        anticoagulant or antiplatelet agents. ASA Grade  ?                       Assessment: III - A patient with severe systemic  ?                       disease. After reviewing the risks and benefits, the  ?                       patient was deemed in satisfactory condition to  ?                       undergo the procedure. The anesthesia plan was to use  ?                       monitored anesthesia care (MAC). Immediately prior to  ?                       administration of medications, the patient was  ?                       re-assessed for adequacy to receive sedatives. The  ?                       heart rate, respiratory rate, oxygen saturations,  ?                       blood pressure, adequacy of pulmonary ventilation, and  ?                       response to care were monitored throughout the  ?                       procedure. The physical status of the patient was  ?                       re-assessed after the procedure. ?                       After obtaining informed consent, the colonoscope was  ?                       passed under direct vision. Throughout the procedure,  ?                       the patient's blood pressure, pulse, and oxygen  ?                       saturations were monitored continuously. The  ?  Colonoscope was introduced through the anus and  ?                       advanced to the the terminal ileum, with  ?                       identification of the appendiceal orifice and IC  ?                       valve. The colonoscopy was performed without  ?                       difficulty. The patient tolerated the procedure well.  ?                       The quality of the bowel preparation was evaluated  ?                       using the BBPS [facility] Bowel Preparation Scale) with  ?                       scores of: Right Colon = 2 (minor amount of residual  ?                       staining, small fragments of stool and/or opaque  ?                        liquid, but mucosa seen well), Transverse Colon = 2  ?                       (minor amount of residual staining, small fragments of  ?                       stool and/or opaque liquid, but mucosa seen well) and  ?                       Left Colon = 2 (minor amount of residual staining,  ?                       small fragments of stool and/or opaque liquid, but  ?                       mucosa seen well). The total BBPS score equals 6. The  ?                       quality of the bowel preparation was good. The  ?                       ileocecal valve, appendiceal orifice, and rectum were  ?                       photographed. ?Findings: ?     The perianal and digital rectal examinations were normal. Pertinent  ?     negatives include normal sphincter tone. ?     A 1 to 2 mm polyp was found in the descending colon. The polyp was  ?     sessile. The polyp  was removed with a cold biopsy forceps. Resection and  ?     retrieval were complete. Estimated blood loss was minimal. ?     Non-bleeding internal hemorrhoids were found during retroflexion. The  ?     hemorrhoids were Grade I (internal hemorrhoids that do not prolapse).  ?     Estimated blood loss: none. ?     The exam was otherwise without abnormality on direct and retroflexion  ?     views. ?     The terminal ileum appeared normal. Estimated blood loss: none. ?Impression:            - One 1 to 2 mm polyp in the descending colon, removed  ?                       with a cold biopsy forceps. Resected and retrieved. ?                       - Non-bleeding internal hemorrhoids. ?                       - The examination was otherwise normal on direct and  ?                       retroflexion views. ?                       - The examined portion of the ileum was normal. ?Recommendation:        - Discharge patient to home. ?                       - Resume previous diet. ?                       - Continue present medications. ?                       - Await pathology  results. ?                       - Repeat colonoscopy for surveillance based on  ?                       pathology results. ?                       - Return to GI office as previously scheduled. ?                       - The findings and recommendations were discussed with  ?                       the patient. ?Procedure Code(s):     --- Professional --- ?                       [phone], Colonoscopy, flexible; with biopsy, single or  ?                       multiple ?Diagnosis Code(s):     --- Professional --- ?  Z86.010, Personal history of colonic polyps ?                       K63.5, Polyp of colon ?                       K64.0, First degree hemorrhoids ?CPT copyright 2019 American Medical Association. All rights reserved. ?The codes documented in this report are preliminary and upon coder review may  ?be revised to meet current compliance requirements. ?Attending Participation: ?     I personally performed the entire procedure. ?Volney American, DO ?Annamaria Helling DO, DO ?09/02/2021 1:32:49 PM ?This report has been signed electronically. ?Number of Addenda: 0 ?Note Initiated On: 09/02/2021 1:02 PM ?Scope Withdrawal Time: 0 hours 16 minutes 49 seconds  ?Total Procedure Duration: 0 hours 20 minutes 47 seconds  ?Estimated Blood Loss:  Estimated blood loss was minimal. ?     [facility] ?

## 2021-09-02 NOTE — Interval H&P Note (Signed)
History and Physical Interval Note: Preprocedure H&P from 09/02/21 ? was reviewed and there was no interval change after seeing and examining the patient.  Written consent was obtained from the patient after discussion of risks, benefits, and alternatives. Patient has consented to proceed with Esophagogastroduodenoscopy and Colonoscopy with possible intervention  ? ?09/02/2021 ?12:20 PM ? ?Tabitha Rice  has presented today for surgery, with the diagnosis of IDA ?+ OCCULT STOOL BLOOD TEST ?HX WHIPPLE PROCEDURE ?HX ADEN POLYPS.  The various methods of treatment have been discussed with the patient and family. After consideration of risks, benefits and other options for treatment, the patient has consented to  Procedure(s): ?COLONOSCOPY WITH PROPOFOL (N/A) ?ESOPHAGOGASTRODUODENOSCOPY (EGD) (N/A) as a surgical intervention.  The patient's history has been reviewed, patient examined, no change in status, stable for surgery.  I have reviewed the patient's chart and labs.  Questions were answered to the patient's satisfaction.   ? ? ?Annamaria Helling ? ? ?

## 2021-09-02 NOTE — Transfer of Care (Signed)
Immediate Anesthesia Transfer of Care Note ? ?Patient: Tabitha Rice ? ?Procedure(s) Performed: COLONOSCOPY WITH PROPOFOL ?ESOPHAGOGASTRODUODENOSCOPY (EGD) ? ?Patient Location: PACU ? ?Anesthesia Type:General ? ?Level of Consciousness: awake, alert  and oriented ? ?Airway & Oxygen Therapy: Patient Spontanous Breathing ? ?Post-op Assessment: Report given to RN and Post -op Vital signs reviewed and stable ? ?Post vital signs: Reviewed and stable ? ?Last Vitals:  ?Vitals Value Taken Time  ?BP 98/57 09/02/21 1332  ?Temp    ?Pulse 67 09/02/21 1332  ?Resp 23 09/02/21 1332  ?SpO2 97 % 09/02/21 1332  ? ? ?Last Pain:  ?Vitals:  ? 09/02/21 1332  ?TempSrc:   ?PainSc: Asleep  ?   ? ?  ? ?Complications: No notable events documented. ?

## 2021-09-03 ENCOUNTER — Encounter: Payer: Self-pay | Admitting: Gastroenterology

## 2021-09-03 LAB — SURGICAL PATHOLOGY

## 2021-09-03 NOTE — Anesthesia Postprocedure Evaluation (Signed)
Anesthesia Post Note ? ?Patient: Tabitha Rice ? ?Procedure(s) Performed: COLONOSCOPY WITH PROPOFOL ?ESOPHAGOGASTRODUODENOSCOPY (EGD) ? ?Patient location during evaluation: PACU ?Anesthesia Type: General ?Level of consciousness: awake and alert ?Pain management: pain level controlled ?Vital Signs Assessment: post-procedure vital signs reviewed and stable ?Respiratory status: spontaneous breathing, nonlabored ventilation, respiratory function stable and patient connected to nasal cannula oxygen ?Cardiovascular status: blood pressure returned to baseline and stable ?Postop Assessment: no apparent nausea or vomiting ?Anesthetic complications: no ? ? ?No notable events documented. ? ? ?Last Vitals:  ?Vitals:  ? 09/02/21 1342 09/02/21 1352  ?BP: 112/79 123/66  ?Pulse: 62 64  ?Resp: 15 15  ?Temp:    ?SpO2: 100% 100%  ?  ?Last Pain:  ?Vitals:  ? 09/02/21 1352  ?TempSrc:   ?PainSc: 0-No pain  ? ? ?  ?  ?  ?  ?  ?  ? ?Molli Barrows ? ? ? ? ?

## 2022-04-03 ENCOUNTER — Other Ambulatory Visit: Payer: Self-pay | Admitting: Internal Medicine

## 2022-04-03 DIAGNOSIS — Z1231 Encounter for screening mammogram for malignant neoplasm of breast: Secondary | ICD-10-CM

## 2022-04-04 ENCOUNTER — Other Ambulatory Visit: Payer: Self-pay | Admitting: Internal Medicine

## 2022-04-04 DIAGNOSIS — D136 Benign neoplasm of pancreas: Secondary | ICD-10-CM

## 2022-04-07 ENCOUNTER — Ambulatory Visit
Admission: RE | Admit: 2022-04-07 | Discharge: 2022-04-07 | Disposition: A | Discharge status: Home / Self Care | Payer: Medicare Other | Source: Ambulatory Visit | Attending: Internal Medicine | Admitting: Internal Medicine

## 2022-04-07 DIAGNOSIS — D136 Benign neoplasm of pancreas: Secondary | ICD-10-CM | POA: Insufficient documentation

## 2022-04-11 ENCOUNTER — Other Ambulatory Visit: Payer: Self-pay | Admitting: Internal Medicine

## 2022-04-11 DIAGNOSIS — D136 Benign neoplasm of pancreas: Secondary | ICD-10-CM

## 2022-04-24 ENCOUNTER — Ambulatory Visit
Admission: RE | Admit: 2022-04-24 | Discharge: 2022-04-24 | Disposition: A | Discharge status: Home / Self Care | Payer: Medicare Other | Source: Ambulatory Visit | Attending: Internal Medicine | Admitting: Internal Medicine

## 2022-04-24 DIAGNOSIS — D136 Benign neoplasm of pancreas: Secondary | ICD-10-CM | POA: Diagnosis present

## 2022-04-24 MED ORDER — GADOBUTROL 1 MMOL/ML IV SOLN: 6.0000 mL | Freq: Once | INTRAVENOUS | Status: DC | PRN

## 2022-04-24 MED ORDER — GADOBUTROL 1 MMOL/ML IV SOLN
6.0000 mL | Freq: Once | INTRAVENOUS | Status: AC | PRN
  Administered 2022-04-24: 6 mL via INTRAVENOUS

## 2022-05-05 ENCOUNTER — Ambulatory Visit
Admission: RE | Admit: 2022-05-05 | Discharge: 2022-05-05 | Disposition: A | Discharge status: Home / Self Care | Payer: Medicare Other | Source: Ambulatory Visit | Attending: Internal Medicine | Admitting: Internal Medicine

## 2022-05-05 DIAGNOSIS — Z1231 Encounter for screening mammogram for malignant neoplasm of breast: Secondary | ICD-10-CM | POA: Diagnosis not present

## 2023-02-02 ENCOUNTER — Encounter: Payer: Self-pay | Admitting: Hematology and Oncology

## 2023-02-13 ENCOUNTER — Encounter: Payer: Self-pay | Admitting: Gastroenterology

## 2023-02-16 ENCOUNTER — Ambulatory Visit: Payer: Medicare Other | Admitting: Certified Registered"

## 2023-02-16 ENCOUNTER — Encounter
Admission: RE | Disposition: A | Discharge status: Home / Self Care | Payer: Self-pay | Source: Home / Self Care | Attending: Gastroenterology

## 2023-02-16 ENCOUNTER — Encounter: Payer: Self-pay | Admitting: Gastroenterology

## 2023-02-16 ENCOUNTER — Ambulatory Visit
Admission: RE | Admit: 2023-02-16 | Discharge: 2023-02-16 | Disposition: A | Discharge status: Home / Self Care | Payer: Medicare Other | Attending: Gastroenterology | Admitting: Gastroenterology

## 2023-02-16 DIAGNOSIS — K640 First degree hemorrhoids: Secondary | ICD-10-CM | POA: Insufficient documentation

## 2023-02-16 DIAGNOSIS — D123 Benign neoplasm of transverse colon: Secondary | ICD-10-CM | POA: Diagnosis not present

## 2023-02-16 DIAGNOSIS — K219 Gastro-esophageal reflux disease without esophagitis: Secondary | ICD-10-CM | POA: Insufficient documentation

## 2023-02-16 DIAGNOSIS — M8580 Other specified disorders of bone density and structure, unspecified site: Secondary | ICD-10-CM | POA: Insufficient documentation

## 2023-02-16 DIAGNOSIS — Z85828 Personal history of other malignant neoplasm of skin: Secondary | ICD-10-CM | POA: Insufficient documentation

## 2023-02-16 DIAGNOSIS — Z800 Family history of malignant neoplasm of digestive organs: Secondary | ICD-10-CM | POA: Insufficient documentation

## 2023-02-16 DIAGNOSIS — Z86010 Personal history of colonic polyps: Secondary | ICD-10-CM | POA: Insufficient documentation

## 2023-02-16 DIAGNOSIS — M1990 Unspecified osteoarthritis, unspecified site: Secondary | ICD-10-CM | POA: Insufficient documentation

## 2023-02-16 DIAGNOSIS — Z09 Encounter for follow-up examination after completed treatment for conditions other than malignant neoplasm: Secondary | ICD-10-CM | POA: Diagnosis present

## 2023-02-16 DIAGNOSIS — K5730 Diverticulosis of large intestine without perforation or abscess without bleeding: Secondary | ICD-10-CM | POA: Diagnosis not present

## 2023-02-16 HISTORY — PX: POLYPECTOMY: SHX5525

## 2023-02-16 HISTORY — PX: COLONOSCOPY WITH PROPOFOL: SHX5780

## 2023-02-16 SURGERY — COLONOSCOPY WITH PROPOFOL
Anesthesia: General

## 2023-02-16 MED ORDER — STERILE WATER FOR IRRIGATION IR SOLN
Status: DC | PRN
  Administered 2023-02-16: 60 mL

## 2023-02-16 MED ORDER — PROPOFOL 10 MG/ML IV BOLUS
INTRAVENOUS | Status: DC | PRN
Start: 2023-02-16 — End: 2023-02-16
  Administered 2023-02-16: 60 mg via INTRAVENOUS

## 2023-02-16 MED ORDER — PROPOFOL 500 MG/50ML IV EMUL
INTRAVENOUS | Status: DC | PRN
  Administered 2023-02-16: 150 ug/kg/min via INTRAVENOUS

## 2023-02-16 MED ORDER — SODIUM CHLORIDE 0.9 % IV SOLN: INTRAVENOUS | Status: DC

## 2023-02-16 MED ORDER — LIDOCAINE HCL (CARDIAC) PF 100 MG/5ML IV SOSY
PREFILLED_SYRINGE | INTRAVENOUS | Status: DC | PRN
  Administered 2023-02-16: 50 mg via INTRAVENOUS

## 2023-02-16 NOTE — Anesthesia Preprocedure Evaluation (Signed)
Anesthesia Evaluation  Patient identified by MRN, date of birth, ID band Patient awake    Reviewed: Allergy & Precautions, NPO status , Patient's Chart, lab work & pertinent test results  History of Anesthesia Complications Negative for: history of anesthetic complications  Airway Mallampati: II  TM Distance: >3 FB Neck ROM: Full    Dental no notable dental hx. (+) Teeth Intact   Pulmonary neg pulmonary ROS, neg sleep apnea, neg COPD, Patient abstained from smoking.Not current smoker   Pulmonary exam normal breath sounds clear to auscultation       Cardiovascular Exercise Tolerance: Good METS(-) hypertension(-) CAD and (-) Past MI negative cardio ROS (-) dysrhythmias  Rhythm:Regular Rate:Normal - Systolic murmurs    Neuro/Psych negative neurological ROS  negative psych ROS   GI/Hepatic ,GERD  ,,(+)     (-) substance abuse    Endo/Other  neg diabetes    Renal/GU negative Renal ROS     Musculoskeletal   Abdominal   Peds  Hematology   Anesthesia Other Findings Past Medical History: 02/12/2015: Allergic rhinitis No date: Anemia No date: Arthritis     Comment:  osteoarthritis 02/12/2015: Arthritis, degenerative 10/25/2012: Basal cell carcinoma of cheek     Comment:  Overview:  Basal cell carcinoma of left medial cheek               treated with Mohs surgery on 10/25/2012.  01/28/2012: Basal cell carcinoma of face     Comment:  Overview:  BCC of left medial cheek treated with Mohs               surgery 01-28-12  02/12/2015: CA skin, basal cell 02/12/2015: Climacteric No date: DDD (degenerative disc disease), cervical No date: DDD (degenerative disc disease), cervical 02/12/2015: Edema, peripheral 02/12/2015: Female genuine stress incontinence No date: GERD (gastroesophageal reflux disease) No date: Heart murmur 02/12/2015: HLD (hyperlipidemia) No date: Hyperlipidemia 02/12/2015: Injury of eyeball     Comment:   Overview:  LEFT RETINAL TRAUMA  No date: Inverted nipple     Comment:  left nipple inverted, nothing new 02/12/2015: Osteopenia 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 No date: Skin cancer, basal cell 02/12/2015: Stasis, venous No date: Wears contact lenses  Reproductive/Obstetrics                             Anesthesia Physical Anesthesia Plan  ASA: 2  Anesthesia Plan: General   Post-op Pain Management: Minimal or no pain anticipated   Induction: Intravenous  PONV Risk Score and Plan: 3 and Propofol infusion, TIVA and Ondansetron  Airway Management Planned: Nasal Cannula  Additional Equipment: None  Intra-op Plan:   Post-operative Plan:   Informed Consent: I have reviewed the patients History and Physical, chart, labs and discussed the procedure including the risks, benefits and alternatives for the proposed anesthesia with the patient or authorized representative who has indicated his/her understanding and acceptance.     Dental advisory given  Plan Discussed with: CRNA and Surgeon  Anesthesia Plan Comments: (Discussed risks of anesthesia with patient, including possibility of difficulty with spontaneous ventilation under anesthesia necessitating airway intervention, PONV, and rare risks such as cardiac or respiratory or neurological events, and allergic reactions. Discussed the role of CRNA in patient's perioperative care. Patient understands.)       Anesthesia Quick Evaluation

## 2023-02-16 NOTE — Transfer of Care (Signed)
Immediate Anesthesia Transfer of Care Note  Patient: Tabitha Rice  Procedure(s) Performed: COLONOSCOPY WITH PROPOFOL POLYPECTOMY  Patient Location: PACU and Endoscopy Unit  Anesthesia Type:General  Level of Consciousness: awake  Airway & Oxygen Therapy: Patient Spontanous Breathing  Post-op Assessment: Report given to RN and Post -op Vital signs reviewed and stable  Post vital signs: Reviewed and stable  Last Vitals:  Vitals Value Taken Time  BP    Temp    Pulse 60 02/16/23 1227  Resp 21 02/16/23 1227  SpO2 100 % 02/16/23 1227  Vitals shown include unfiled device data.  Last Pain:  Vitals:   02/16/23 1137  TempSrc: Temporal         Complications: No notable events documented.

## 2023-02-16 NOTE — Interval H&P Note (Signed)
History and Physical Interval Note: Preprocedure H&P from 02/16/23  was reviewed and there was no interval change after seeing and examining the patient.  Written consent was obtained from the patient after discussion of risks, benefits, and alternatives. Patient has consented to proceed with Colonoscopy with possible intervention   02/16/2023 11:49 AM  Tabitha Rice  has presented today for surgery, with the diagnosis of V12.72 (ICD-9-CM) - Z86.010 (ICD-10-CM) - Hx of adenomatous colonic polyps.  The various methods of treatment have been discussed with the patient and family. After consideration of risks, benefits and other options for treatment, the patient has consented to  Procedure(s): COLONOSCOPY WITH PROPOFOL (N/A) as a surgical intervention.  The patient's history has been reviewed, patient examined, no change in status, stable for surgery.  I have reviewed the patient's chart and labs.  Questions were answered to the patient's satisfaction.     Jaynie Collins

## 2023-02-16 NOTE — H&P (Signed)
Pre-Procedure H&P   Patient ID: Tabitha Rice is a 77 y.o. female.  Gastroenterology Provider: Jaynie Collins, DO  Referring Provider: Fransico Setters, NP PCP: Marguarite Arbour, MD  Date: 02/16/2023  HPI Tabitha Rice is a 77 y.o. female who presents today for Colonoscopy for surveillance- phx colon polyps .  Patient's last colonoscopy was in April 2023 with 1 adenomatous polyp removed and internal hemorrhoids, however, prep was limited.  Tubulovillous adenoma with high-grade dysplasia on colonoscopy in June 2009  She is status post Whipple for pancreatic cystadenoma.  Father with a history of gastric cancer.  No family history of colon cancer or colon polyps  Bowels overall move regularly without melena or hematochezia.  Hemoglobin 12.9 MCV 90 platelets 254,000 creatinine 1.0   Past Medical History:  Diagnosis Date   Allergic rhinitis 02/12/2015   Anemia    Arthritis    osteoarthritis   Arthritis, degenerative 02/12/2015   Basal cell carcinoma of cheek 10/25/2012   Overview:  Basal cell carcinoma of left medial cheek treated with Mohs surgery on 10/25/2012.    Basal cell carcinoma of face 01/28/2012   Overview:  BCC of left medial cheek treated with Mohs surgery 01-28-12    CA skin, basal cell 02/12/2015   Climacteric 02/12/2015   DDD (degenerative disc disease), cervical    DDD (degenerative disc disease), cervical    Edema, peripheral 02/12/2015   Female genuine stress incontinence 02/12/2015   GERD (gastroesophageal reflux disease)    Heart murmur    HLD (hyperlipidemia) 02/12/2015   Hyperlipidemia    Injury of eyeball 02/12/2015   Overview:  LEFT RETINAL TRAUMA    Inverted nipple    left nipple inverted, nothing new   Osteopenia 02/12/2015   Pancreatic cystadenoma    Skin cancer, basal cell    Stasis, venous 02/12/2015   Wears contact lenses     Past Surgical History:  Procedure Laterality Date   ABDOMINAL HYSTERECTOMY  1982   APPENDECTOMY     BREAST BIOPSY  Left 11/23/2014   Procedure: BREAST BIOPSY WITH NEEDLE LOCALIZATION;  Surgeon: Nadeen Landau, MD;  Location: [facility] OR[facility];  Service: General;  Laterality: Left;   BREAST BIOPSY Left 09/25/2014   complex sclerosing lesion   BREAST BIOPSY Right 01/13/2017   2 area/radial scar   BREAST EXCISIONAL BIOPSY Right 02/20/2017   radial scar   BREAST LUMPECTOMY WITH NEEDLE LOCALIZATION Right 02/20/2017   Procedure: EXCISION OF RIGHT BREAST MASS WITH NEEDLE LOCALIZATION;  Surgeon: Nadeen Landau, MD;  Location: [facility];  Service: General;  Laterality: Right;   CATARACT EXTRACTION W/PHACO Right 01/12/2018   Procedure: CATARACT EXTRACTION PHACO AND INTRAOCULAR LENS PLACEMENT (IOC)  RIGHT TORIC LENS ? MAYBE;  Surgeon: Lockie Mola, MD;  Location: [facility];  Service: Ophthalmology;  Laterality: Right;   CATARACT EXTRACTION W/PHACO Left 02/03/2018   Procedure: CATARACT EXTRACTION PHACO AND INTRAOCULAR LENS PLACEMENT (IOC) LEFT TORIC LENS;  Surgeon: Lockie Mola, MD;  Location: [facility];  Service: Ophthalmology;  Laterality: Left;   CHOLECYSTECTOMY     colonoscopy with polypectomy     COLONOSCOPY WITH PROPOFOL N/A 08/31/2017   Procedure: COLONOSCOPY WITH PROPOFOL;  Surgeon: Scot Jun, MD;  Location: [facility] ENDOSCOPY;  Service: Endoscopy;  Laterality: N/A;   COLONOSCOPY WITH PROPOFOL N/A 09/02/2021   Procedure: COLONOSCOPY WITH PROPOFOL;  Surgeon: Jaynie Collins, DO;  Location: [facility] ENDOSCOPY;  Service: Gastroenterology;  Laterality: N/A;   DILATION AND CURETTAGE OF UTERUS  ESOPHAGOGASTRODUODENOSCOPY N/A 09/02/2021   Procedure: ESOPHAGOGASTRODUODENOSCOPY (EGD);  Surgeon: Jaynie Collins, DO;  Location: [facility] ENDOSCOPY;  Service: Gastroenterology;  Laterality: N/A;   ESOPHAGOGASTRODUODENOSCOPY (EGD) WITH PROPOFOL N/A 06/12/2017   Procedure: ESOPHAGOGASTRODUODENOSCOPY (EGD) WITH PROPOFOL;  Surgeon: Scot Jun, MD;  Location: [facility]  ENDOSCOPY;  Service: Endoscopy;  Laterality: N/A;   EYE SURGERY     Partial hystrectomy  1982   SHOULDER ARTHROSCOPY WITH OPEN ROTATOR CUFF REPAIR Left 06/26/2017   Procedure: SHOULDER ARTHROSCOPY WITH OPEN ROTATOR CUFF REPAIR;  Surgeon: Signa Kell, MD;  Location: [facility] ORS;  Service: Orthopedics;  Laterality: Left;   WHIPPLE PROCEDURE  2017   thought pt had a slow growing pancreatic mass but it came back benign    Family History Father- gastric ca No h/o GI disease or malignancy  Review of Systems  Constitutional:  Negative for activity change, appetite change, chills, diaphoresis, fatigue, fever and unexpected weight change.  HENT:  Negative for trouble swallowing and voice change.   Respiratory:  Negative for shortness of breath and wheezing.   Cardiovascular:  Negative for chest pain, palpitations and leg swelling.  Gastrointestinal:  Negative for abdominal distention, abdominal pain, anal bleeding, blood in stool, constipation, diarrhea, nausea, rectal pain and vomiting.  Musculoskeletal:  Negative for arthralgias and myalgias.  Skin:  Negative for color change and pallor.  Neurological:  Negative for dizziness, syncope and weakness.  Psychiatric/Behavioral:  Negative for confusion.   All other systems reviewed and are negative.    Medications No current facility-administered medications on file prior to encounter.   Current Outpatient Medications on File Prior to Encounter  Medication Sig Dispense Refill   aspirin 81 MG chewable tablet Chew 81 mg by mouth daily.     atorvastatin (LIPITOR) 10 MG tablet Take 10 mg by mouth at bedtime.      Cholecalciferol (VITAMIN D3) 5000 units CAPS Take 5,000 Units by mouth daily.     Cyanocobalamin (VITAMIN B12) 3000 MCG SUBL Place 3,000 Units under the tongue daily.     Multiple Vitamin (MULTI-VITAMINS) TABS Take 1 tablet by mouth daily.     oxybutynin (DITROPAN-XL) 5 MG 24 hr tablet Take 5 mg by mouth at bedtime.     CRANBERRY PO Take  by mouth daily. (Patient not taking: Reported on 09/02/2021)     tamoxifen (NOLVADEX) 20 MG tablet Take 1 tablet (20 mg total) by mouth daily. (Patient not taking: Reported on 02/16/2023) 90 tablet 3   tolterodine (DETROL LA) 4 MG 24 hr capsule Take 4 mg by mouth daily. (Patient not taking: Reported on 09/02/2021)      Pertinent medications related to GI and procedure were reviewed by me with the patient prior to the procedure   Current Facility-Administered Medications:    0.9 %  sodium chloride infusion, , Intravenous, Continuous, Jaynie Collins, DO, Last Rate: 20 mL/hr at 02/16/23 1142, New Bag at 02/16/23 1142  sodium chloride 20 mL/hr at 02/16/23 1142       Allergies  Allergen Reactions   Ambien [Zolpidem] Itching and Rash   Penicillins Itching and Rash    Has patient had a PCN reaction causing immediate rash, facial/tongue/throat swelling, SOB or lightheadedness with hypotension: No Has patient had a PCN reaction causing severe rash involving mucus membranes or skin necrosis: No Has patient had a PCN reaction that required hospitalization: No Has patient had a PCN reaction occurring within the last 10 years: No If all of the above answers are "NO", then may  proceed with Cephalosporin use.    Allergies were reviewed by me prior to the procedure  Objective   Body mass index is 23.74 kg/m. Vitals:   02/16/23 1137  BP: (!) 153/80  Pulse: 86  Temp: (!) 97.3 F (36.3 C)  TempSrc: Temporal  SpO2: 100%  Weight: 60.8 kg     Physical Exam Vitals and nursing note reviewed.  Constitutional:      General: She is not in acute distress.    Appearance: Normal appearance. She is not ill-appearing, toxic-appearing or diaphoretic.  HENT:     Head: Normocephalic and atraumatic.     Nose: Nose normal.     Mouth/Throat:     Mouth: Mucous membranes are moist.     Pharynx: Oropharynx is clear.  Eyes:     General: No scleral icterus.    Extraocular Movements: Extraocular  movements intact.  Cardiovascular:     Rate and Rhythm: Normal rate and regular rhythm.     Heart sounds: Normal heart sounds. No murmur heard.    No friction rub. No gallop.  Pulmonary:     Effort: Pulmonary effort is normal. No respiratory distress.     Breath sounds: Normal breath sounds. No wheezing, rhonchi or rales.  Abdominal:     General: Bowel sounds are normal. There is no distension.     Palpations: Abdomen is soft.     Tenderness: There is no abdominal tenderness. There is no guarding or rebound.  Musculoskeletal:     Cervical back: Neck supple.     Right lower leg: No edema.     Left lower leg: No edema.  Skin:    General: Skin is warm and dry.     Coloration: Skin is not jaundiced or pale.  Neurological:     General: No focal deficit present.     Mental Status: She is alert and oriented to person, place, and time. Mental status is at baseline.  Psychiatric:        Mood and Affect: Mood normal.        Behavior: Behavior normal.        Thought Content: Thought content normal.        Judgment: Judgment normal.      Assessment:  Ms. BLONDIE RIGGSBEE is a 77 y.o. female  who presents today for Colonoscopy for surveillance- phx colon polyps .  Plan:  Colonoscopy with possible intervention today  Colonoscopy with possible biopsy, control of bleeding, polypectomy, and interventions as necessary has been discussed with the patient/patient representative. Informed consent was obtained from the patient/patient representative after explaining the indication, nature, and risks of the procedure including but not limited to death, bleeding, perforation, missed neoplasm/lesions, cardiorespiratory compromise, and reaction to medications. Opportunity for questions was given and appropriate answers were provided. Patient/patient representative has verbalized understanding is amenable to undergoing the procedure.   Jaynie Collins, DO  [facility]  Gastroenterology  Portions of the record may have been created with voice recognition software. Occasional wrong-word or 'sound-a-like' substitutions may have occurred due to the inherent limitations of voice recognition software.  Read the chart carefully and recognize, using context, where substitutions may have occurred.

## 2023-02-16 NOTE — Anesthesia Postprocedure Evaluation (Signed)
Anesthesia Post Note  Patient: Tabitha Rice  Procedure(s) Performed: COLONOSCOPY WITH PROPOFOL POLYPECTOMY  Patient location during evaluation: PACU Anesthesia Type: General Level of consciousness: awake and alert Pain management: pain level controlled Vital Signs Assessment: post-procedure vital signs reviewed and stable Respiratory status: spontaneous breathing, nonlabored ventilation, respiratory function stable and patient connected to nasal cannula oxygen Cardiovascular status: blood pressure returned to baseline and stable Postop Assessment: no apparent nausea or vomiting Anesthetic complications: no   No notable events documented.   Last Vitals:  Vitals:   02/16/23 1238 02/16/23 1248  BP: 121/66 128/66  Pulse: 61 70  Temp:    SpO2: 100% 100%    Last Pain:  Vitals:   02/16/23 1248  TempSrc:   PainSc: 0-No pain                 Corinda Gubler

## 2023-02-16 NOTE — Op Note (Signed)
[facility] Gastroenterology Patient Name: Tabitha Rice Procedure Date: 02/16/2023 11:48 AM MRN: [id number] Account #: [id number] Date of Birth: [birth year] Admit Type: Outpatient Age: 77 Room: [facility] ENDO ROOM 2 Gender: Female Note Status: Finalized Instrument Name: Peds Colonoscope [id number] Procedure:             Colonoscopy Indications:           High risk colon cancer surveillance: Personal history                         of colonic polyps Providers:             Trenda Moots, DO Referring MD:          Duane Lope. Judithann Sheen, MD (Referring MD) Medicines:             Monitored Anesthesia Care Complications:         No immediate complications. Estimated blood loss:                         Minimal. Procedure:             Pre-Anesthesia Assessment:                        - Prior to the procedure, a History and Physical was                         performed, and patient medications and allergies were                         reviewed. The patient is competent. The risks and                         benefits of the procedure and the sedation options and                         risks were discussed with the patient. All questions                         were answered and informed consent was obtained.                         Patient identification and proposed procedure were                         verified by the physician, the nurse, the anesthetist                         and the technician in the endoscopy suite. Mental                         Status Examination: alert and oriented. Airway                         Examination: normal oropharyngeal airway and neck                         mobility. Respiratory Examination: clear to  auscultation. CV Examination: RRR, no murmurs, no S3                         or S4. Prophylactic Antibiotics: The patient does not                         require prophylactic antibiotics. Prior                          Anticoagulants: The patient has taken no anticoagulant                         or antiplatelet agents. ASA Grade Assessment: III - A                         patient with severe systemic disease. After reviewing                         the risks and benefits, the patient was deemed in                         satisfactory condition to undergo the procedure. The                         anesthesia plan was to use monitored anesthesia care                         (MAC). Immediately prior to administration of                         medications, the patient was re-assessed for adequacy                         to receive sedatives. The heart rate, respiratory                         rate, oxygen saturations, blood pressure, adequacy of                         pulmonary ventilation, and response to care were                         monitored throughout the procedure. The physical                         status of the patient was re-assessed after the                         procedure.                        After obtaining informed consent, the colonoscope was                         passed under direct vision. Throughout the procedure,                         the patient's blood pressure, pulse, and oxygen  saturations were monitored continuously. The                         Colonoscope was introduced through the anus and                         advanced to the the terminal ileum, with                         identification of the appendiceal orifice and IC                         valve. The colonoscopy was performed without                         difficulty. The patient tolerated the procedure well.                         The quality of the bowel preparation was evaluated                         using the BBPS [place] Post-Acute Bowel Preparation Scale) with                         scores of: Right Colon = 2 (minor amount of residual                         staining, small  fragments of stool and/or opaque                         liquid, but mucosa seen well), Transverse Colon = 3                         (entire mucosa seen well with no residual staining,                         small fragments of stool or opaque liquid) and Left                         Colon = 2 (minor amount of residual staining, small                         fragments of stool and/or opaque liquid, but mucosa                         seen well). The total BBPS score equals 7. The quality                         of the bowel preparation was good. The terminal ileum,                         ileocecal valve, appendiceal orifice, and rectum were                         photographed. Findings:      The perianal and digital rectal examinations were normal. Pertinent       negatives include normal sphincter  tone.      The terminal ileum appeared normal. Estimated blood loss: none.      Retroflexion in the right colon was performed.      A 1 to 2 mm polyp was found in the transverse colon. The polyp was       sessile. The polyp was removed with a jumbo cold forceps. Resection and       retrieval were complete. Estimated blood loss was minimal.      Multiple small-mouthed diverticula were found in the sigmoid colon.       Estimated blood loss: none.      Non-bleeding internal hemorrhoids were found during retroflexion. The       hemorrhoids were Grade I (internal hemorrhoids that do not prolapse).       Estimated blood loss: none.      The exam was otherwise without abnormality on direct and retroflexion       views. Impression:            - The examined portion of the ileum was normal.                        - One 1 to 2 mm polyp in the transverse colon, removed                         with a jumbo cold forceps. Resected and retrieved.                        - Diverticulosis in the sigmoid colon.                        - Non-bleeding internal hemorrhoids.                        - The examination  was otherwise normal on direct and                         retroflexion views. Recommendation:        - Patient has a contact number available for                         emergencies. The signs and symptoms of potential                         delayed complications were discussed with the patient.                         Return to normal activities tomorrow. Written                         discharge instructions were provided to the patient.                        - Discharge patient to home.                        - Resume previous diet.                        - Continue present medications.                        -  Await pathology results.                        - Repeat colonoscopy in 5 years or discontinue due to                         age for surveillance based on pathology results.                        - Return to referring physician as previously                         scheduled.                        - The findings and recommendations were discussed with                         the patient. Procedure Code(s):     --- Professional ---                        [phone], Colonoscopy, flexible; with biopsy, single or                         multiple Diagnosis Code(s):     --- Professional ---                        Z86.010, Personal history of colonic polyps                        K64.0, First degree hemorrhoids                        D12.3, Benign neoplasm of transverse colon (hepatic                         flexure or splenic flexure)                        K57.30, Diverticulosis of large intestine without                         perforation or abscess without bleeding CPT copyright 2022 American Medical Association. All rights reserved. The codes documented in this report are preliminary and upon coder review may  be revised to meet current compliance requirements. Attending Participation:      I personally performed the entire procedure. Elfredia Nevins, DO Jaynie Collins  DO, DO 02/16/2023 12:26:02 PM This report has been signed electronically. Number of Addenda: 0 Note Initiated On: 02/16/2023 11:48 AM Scope Withdrawal Time: 0 hours 12 minutes 13 seconds  Total Procedure Duration: 0 hours 18 minutes 16 seconds  Estimated Blood Loss:  Estimated blood loss was minimal.      [facility]

## 2023-02-16 NOTE — Anesthesia Procedure Notes (Signed)
Procedure Name: MAC Date/Time: 02/16/2023 11:57 AM  Performed by: Cheral Bay, CRNAPre-anesthesia Checklist: Patient identified, Emergency Drugs available, Suction available, Patient being monitored and Timeout performed Patient Re-evaluated:Patient Re-evaluated prior to induction Oxygen Delivery Method: Nasal cannula Induction Type: IV induction Placement Confirmation: positive ETCO2 and CO2 detector

## 2023-02-17 ENCOUNTER — Encounter: Payer: Self-pay | Admitting: Gastroenterology

## 2023-02-17 LAB — SURGICAL PATHOLOGY

## 2023-04-20 ENCOUNTER — Other Ambulatory Visit: Payer: Self-pay | Admitting: Internal Medicine

## 2023-04-20 DIAGNOSIS — Z1231 Encounter for screening mammogram for malignant neoplasm of breast: Secondary | ICD-10-CM

## 2023-05-19 ENCOUNTER — Ambulatory Visit
Admission: RE | Admit: 2023-05-19 | Discharge: 2023-05-19 | Disposition: A | Discharge status: Home / Self Care | Payer: Medicare Other | Source: Ambulatory Visit | Attending: Internal Medicine | Admitting: Internal Medicine

## 2023-05-19 DIAGNOSIS — Z1231 Encounter for screening mammogram for malignant neoplasm of breast: Secondary | ICD-10-CM | POA: Insufficient documentation

## 2024-04-26 ENCOUNTER — Other Ambulatory Visit: Payer: Self-pay | Admitting: Internal Medicine

## 2024-04-26 DIAGNOSIS — Z1231 Encounter for screening mammogram for malignant neoplasm of breast: Secondary | ICD-10-CM

## 2024-05-24 ENCOUNTER — Ambulatory Visit
Admission: RE | Admit: 2024-05-24 | Discharge: 2024-05-24 | Disposition: A | Discharge status: Home / Self Care | Source: Ambulatory Visit | Attending: Internal Medicine | Admitting: Internal Medicine

## 2024-05-24 DIAGNOSIS — Z1231 Encounter for screening mammogram for malignant neoplasm of breast: Secondary | ICD-10-CM | POA: Diagnosis present
